# Patient Record
Sex: Male | Born: 1985 | Race: White | Hispanic: No | Marital: Single | Smoking: Current every day smoker
Health system: Southern US, Community
[De-identification: ages and names within clinical notes are randomized; demographics above are authoritative.]

## PROBLEM LIST (undated history)

## (undated) DIAGNOSIS — R569 Unspecified convulsions: Secondary | ICD-10-CM

## (undated) DIAGNOSIS — H5440 Blindness, one eye, unspecified eye: Secondary | ICD-10-CM

## (undated) DIAGNOSIS — R51 Headache: Secondary | ICD-10-CM

## (undated) DIAGNOSIS — M21372 Foot drop, left foot: Secondary | ICD-10-CM

## (undated) DIAGNOSIS — Z9889 Other specified postprocedural states: Secondary | ICD-10-CM

## (undated) DIAGNOSIS — M8700 Idiopathic aseptic necrosis of unspecified bone: Secondary | ICD-10-CM

## (undated) DIAGNOSIS — R112 Nausea with vomiting, unspecified: Secondary | ICD-10-CM

## (undated) DIAGNOSIS — R040 Epistaxis: Secondary | ICD-10-CM

## (undated) DIAGNOSIS — K219 Gastro-esophageal reflux disease without esophagitis: Secondary | ICD-10-CM

## (undated) DIAGNOSIS — Z5189 Encounter for other specified aftercare: Secondary | ICD-10-CM

## (undated) DIAGNOSIS — F419 Anxiety disorder, unspecified: Secondary | ICD-10-CM

## (undated) DIAGNOSIS — H9191 Unspecified hearing loss, right ear: Secondary | ICD-10-CM

## (undated) HISTORY — PX: LUMBAR DISC SURGERY: SHX700

## (undated) HISTORY — PX: KNEE ARTHROSCOPY: SUR90

## (undated) HISTORY — PX: APPENDECTOMY: SHX54

---
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
## 2003-01-08 ENCOUNTER — Encounter: Payer: Self-pay | Admitting: Neurosurgery

## 2003-01-08 ENCOUNTER — Encounter: Admission: RE | Admit: 2003-01-08 | Discharge: 2003-01-08 | Payer: Self-pay | Admitting: Neurosurgery

## 2003-03-28 ENCOUNTER — Encounter
Admission: RE | Admit: 2003-03-28 | Discharge: 2003-06-26 | Payer: Self-pay | Admitting: Physical Medicine & Rehabilitation

## 2003-05-10 ENCOUNTER — Encounter
Admission: RE | Admit: 2003-05-10 | Discharge: 2003-08-08 | Payer: Self-pay | Admitting: Physical Medicine & Rehabilitation

## 2003-05-31 ENCOUNTER — Ambulatory Visit (HOSPITAL_COMMUNITY)
Admission: RE | Admit: 2003-05-31 | Discharge: 2003-05-31 | Payer: Self-pay | Admitting: Physical Medicine & Rehabilitation

## 2003-06-07 ENCOUNTER — Ambulatory Visit (HOSPITAL_COMMUNITY)
Admission: RE | Admit: 2003-06-07 | Discharge: 2003-06-07 | Payer: Self-pay | Admitting: Physical Medicine & Rehabilitation

## 2003-09-23 ENCOUNTER — Encounter
Admission: RE | Admit: 2003-09-23 | Discharge: 2003-12-22 | Payer: Self-pay | Admitting: Physical Medicine & Rehabilitation

## 2003-12-25 ENCOUNTER — Encounter
Admission: RE | Admit: 2003-12-25 | Discharge: 2004-03-24 | Payer: Self-pay | Admitting: Physical Medicine & Rehabilitation

## 2004-01-01 ENCOUNTER — Ambulatory Visit (HOSPITAL_COMMUNITY): Admission: RE | Admit: 2004-01-01 | Discharge: 2004-01-01 | Payer: Self-pay | Admitting: Internal Medicine

## 2004-07-05 HISTORY — PX: BRAIN SURGERY: SHX531

## 2005-01-11 ENCOUNTER — Encounter: Admission: RE | Admit: 2005-01-11 | Discharge: 2005-01-11 | Payer: Self-pay | Admitting: Neurosurgery

## 2005-01-20 ENCOUNTER — Inpatient Hospital Stay (HOSPITAL_COMMUNITY): Admission: RE | Admit: 2005-01-20 | Discharge: 2005-01-24 | Payer: Self-pay | Admitting: Neurosurgery

## 2005-02-23 ENCOUNTER — Encounter: Admission: RE | Admit: 2005-02-23 | Discharge: 2005-02-23 | Payer: Self-pay | Admitting: Neurosurgery

## 2005-11-01 ENCOUNTER — Encounter: Admission: RE | Admit: 2005-11-01 | Discharge: 2005-11-01 | Payer: Self-pay | Admitting: Orthopedic Surgery

## 2005-11-09 ENCOUNTER — Encounter: Payer: Self-pay | Admitting: Orthopedic Surgery

## 2005-11-10 ENCOUNTER — Encounter: Admission: RE | Admit: 2005-11-10 | Discharge: 2005-11-10 | Payer: Self-pay | Admitting: Orthopedic Surgery

## 2005-11-21 ENCOUNTER — Emergency Department (HOSPITAL_COMMUNITY): Admission: EM | Admit: 2005-11-21 | Discharge: 2005-11-22 | Payer: Self-pay | Admitting: Emergency Medicine

## 2005-12-02 ENCOUNTER — Encounter: Payer: Self-pay | Admitting: Vascular Surgery

## 2005-12-02 ENCOUNTER — Ambulatory Visit (HOSPITAL_COMMUNITY): Admission: RE | Admit: 2005-12-02 | Discharge: 2005-12-02 | Payer: Self-pay | Admitting: Orthopedic Surgery

## 2005-12-03 ENCOUNTER — Encounter: Payer: Self-pay | Admitting: Orthopedic Surgery

## 2005-12-21 ENCOUNTER — Inpatient Hospital Stay (HOSPITAL_COMMUNITY): Admission: RE | Admit: 2005-12-21 | Discharge: 2005-12-24 | Payer: Self-pay | Admitting: Orthopedic Surgery

## 2006-08-26 ENCOUNTER — Encounter: Admission: RE | Admit: 2006-08-26 | Discharge: 2006-08-26 | Payer: Self-pay | Admitting: Neurosurgery

## 2006-10-14 ENCOUNTER — Emergency Department (HOSPITAL_COMMUNITY): Admission: EM | Admit: 2006-10-14 | Discharge: 2006-10-15 | Payer: Self-pay | Admitting: Emergency Medicine

## 2008-01-16 ENCOUNTER — Ambulatory Visit (HOSPITAL_COMMUNITY): Admission: RE | Admit: 2008-01-16 | Discharge: 2008-01-17 | Payer: Self-pay | Admitting: Orthopedic Surgery

## 2010-01-19 ENCOUNTER — Encounter: Admission: RE | Admit: 2010-01-19 | Discharge: 2010-01-19 | Payer: Self-pay | Admitting: Neurosurgery
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
## 2010-11-20 NOTE — Op Note (Signed)
[place]  Patient:    [name], [name]                      MRN: [id number] Proc. Date: 10/06/00 Adm. Date:  [id number] Attending:  Vikki Ports.                           Operative Report  PREOPERATIVE DIAGNOSIS:  Acute appendicitis.  POSTOPERATIVE DIAGNOSIS:  Ruptured appendicitis.  OPERATION PERFORMED:  Laparoscopic ruptured appendectomy.  SURGEON:  Stephenie Acres, M.D.  ASSISTANT:  Chevis Pretty, M.D.  ANESTHESIA:  General.  DESCRIPTION OF PROCEDURE:  The patient was taken to the operating room and placed in supine position.  After adequate anesthesia was induced using endotracheal tube the abdomen was prepped and draped in normal sterile fashion.  Using a short superumbilical vertical midline incision, I dissected down to the fascia.  The fascia was opened vertically.  The peritoneum was entered and a 90 Vicryl pursestring suture was placed around the fascial defect.  The Hasson trocar was placed in the abdomen.  The abdomen was insufflated with carbon dioxide.  Under direct visualization, a 5 mm port was placed in the right upper quadrant and a 12 mm port was placed just left of the midline in the superpubic region.  There was a significant amount of small bowel adherent down to the right lower quadrant and pelvis. This was mobilized.  There was a fair amount of exudate on the serosa of the small bowel.  The tip of the appendix was identified.  It appeared to be slightly necrotic and there was some exudative type fluid in the pelvis.  The appendix was mobilized and retracted anteriorly and dissection of the mesentery was undertaken.  The appendiceal artery was dissected, triply clipped and divided. The base of the appendix at its junction with the cecum was completely identified and was divided using a laparoscopic GIA stapling device.  It did not completely cut across the base of the appendix and therefore a second  fire was undertaken using the GIA stapling device.  The appendix then was placed in an Endo Catch bag.  The pelvis and the right lower quadrant were copiously irrigated with 4L of saline solution until fluid was completely clear.  The appendix was then removed in the Endo catch bag through the umbilical port. Adequate hemostasis was ensured.  All incisions were injected using Marcaine. The superumbilical fascial defect was closed with the 0 Vicryl pursestring suture.  Skin incisions were closed in subcuticular 4-0 Monocryl. Steri-Strips, sterile dressings were applied.  The patient tolerated the procedure well and went to PACU in good condition. DD:  10/06/00 TD:  10/06/00 Job: [id number]

## 2010-11-20 NOTE — Assessment & Plan Note (Signed)
[name] is back regarding his TBI and polytrauma, as well as his spinal cord  injury.  His foot pain seems to be a bit better with the Topamax being  started.  He is still having pain over the right first toe and the dorsum of  the foot.  His behavior has been okay off the Tegretol.  He is still limited  in ambulation secondary to mental and physical fatigue and right foot pain.  Mom notes some weight loss with the Topamax.  He wonders if it is  contributing to some occasional diarrhea as well.  Mom does not note any  obvious inappropriate behavior and she seems to put her foot down on any  outbursts that are noted.  The patient was seen by orthopedic surgery a few  weeks back who felt that he was stable from a standpoint of avascular  necrosis in his foot.  [name] follows up with Dr. Karleen Hampshire regarding his  vision.  He denies any new headaches or double vision at this point.  [name]  currently walks with his right CAM boot.   REVIEW OF SYSTEMS:  The patient denies any shortness of breath, chest pain,  nausea, vomiting, diarrhea.  He does report a new rash on the back and near  the axillary area along the T4 level.  Sleep has been fair although his feet  will bother him while he is sitting down or at nighttime.  He does not mind  the cold.   PHYSICAL EXAMINATION:  GENERAL:  The patient is pleasant in no acute  distress.  VITAL SIGNS:  Blood pressure 106/69, pulse is 83, saturating 100% on room  air.  EXTREMITIES:  On examination of the lower extremities, he has improving  strength in the foot.  It is  3-3+/5 with ankle dorsiflexion and plantar flexion.  Quadriceps strength and  hamstrings were 4-4+/5.  Left ankle seems to be stable to unchanged.  The  patient ambulated with his CAM boot today on the right foot and did pretty  well from a stability and fluidity standpoint in his gait.  There was less  antalgia noted on the right side.  Skin was warm to touch but remained  hypersensitive  in the foot and the left foot was nontender.  He was wearing  a work boot without discomfort.  NEUROLOGIC:  Cognitively, the patient seemed to be generally appropriate  although laughed at times.  Cranial nerve exam was intact.  We did see some  maculopapular rash that was scabbing in the right axillary area and there  were also two spots approximately 2 x 3 inches respectively on the mid back  at the same level.   ASSESSMENT:  1. Status post traumatic brain injury.  2. Thoracic myelopathy.  3. 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  4. Emotional dyscontrol syndrome.  5. Probable shingles.   PLAN:  1. We will place him on a trial of acyclovir 4000 mg a day for seven days.     He may place some hydrocortisone cream to the affected areas as well.  2. Increase his Topamax to 200 mg q.h.s.  He may see some further untoward     side effects such as diarrhea and we may have to consider stopping it if     that is the case.  3. The patient needs to continue with his exercise program at home including     walking and social interaction.  4. I will see the patient  back in approximately six weeks' to two months'     time.      [name], M.D.   ZTS/MedQ  D:  07/29/2003 14:56:34  T:  07/29/2003 15:31:22  Job #:  [id number]
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
## 2010-11-20 NOTE — Discharge Summary (Signed)
[place]  Patient:    [name], [name]                      MRN: [id number] Adm. Date:  [id number] Disc. Date: [id number] Attending:  Vikki Ports.                           Discharge Summary  ADMISSION DIAGNOSIS:  Appendicitis.  DISCHARGE DIAGNOSIS:  Ruptured appendicitis.  CONDITION ON DISCHARGE:  Good and improved.  DISPOSITION:  Discharged to home.  HISTORY OF PRESENT ILLNESS:  The patient is a 25 year old white male with a two- to three-day history of abdominal pain localizing to the right lower quadrant.  The patient was seen in the emergency room and underwent CT scan of the abdomen which was consistent with probable acute appendicitis.  White blood cell count was 18,000.  The patient was tender on examination in the right lower quadrant with positive rebound.  The patient had been given IV Toradol in the emergency room and, therefore, I opted to delay surgery for six hours.  HOSPITAL COURSE:  The patient was then taken to the operating room where he underwent a laparoscopic ruptured appendectomy with copious irrigation of the abdomen.  Postoperatively he did well but sustained an ileus for two to three days, having intermittent emesis.  He began having diarrhea on postoperative day #2, was started on a clear liquid diet which he tolerated, and was advanced to a regular diet.  His white count decreased to 11,000, and he became afebrile by postoperative day #3.  Incisions were clean, and he was discharged to home.  DISCHARGE MEDICATIONS:  Vicodin for pain.  FOLLOW-UP:  With me in one week. DD:  10/25/00 TD:  10/26/00 Job: [id number]

## 2010-11-20 NOTE — Assessment & Plan Note (Signed)
HISTORY:  The patient is here regarding his TBI and spinal cord injury.  From the pain standpoint, the patient has been doing a little bit better.  His affect is generally stable and pleasant.  He has had persistent  diarrhea.  He has had diarrhea despite the change from Topamax to Cymbalta,  and being off the Tegretol.  His mother did not call me with further  complaints of this, and after questioning today, he is having at least 10-15  loose stools on a regular basis.  They are not always loose, but generally  are liquid in form.  He denies any fever.  He does note frequent abdominal  cramping.  The patient rates his pain at a 4/10 on average.  He still wears his cam  boot on the right foot, but often walks with his shoe.  His toes remain  sensitive.  He has left foot drop still.  The patient denies any problems  with sleep.  He tries to remain as active as he can with his GI issues at  present.   REVIEW OF SYSTEMS:  The patient denies any chest pain, shortness of breath,  cold or flu symptoms.  He denies any new weakness, numbness, spasms, or  problems with his vision.  He still has problems with field cut, due to his  optic nerve injury.  His mood has been good.  He denies headaches.  He has  had some occasional nausea, abdominal spasms, and the diarrhea as mentioned  above.  He denies fever or chills.  He has lost some weight, to a moderate  standpoint, but has grown essentially an inch over the last year's time.   PHYSICAL EXAMINATION:  VITAL SIGNS:  Blood pressure stable, afebrile.  GENERAL:  The patient is alert and oriented.  He remains a bit playful and  distractable, but focuses when questioned.  EXTREMITIES/NEUROLOGIC:  On examination of the lower extremities, the  strength is improving in the right foot at 4/5.  He still has some  difficulties with left ankle eversion and inversion.  Left ankle  dorsiflexion is 1+ still with plantar flexion 2+ to 3.  Hamstrings are 4+ to  5/5 bilaterally.  Right foot hypersensitivity has improved, and really more  so at the toes at this point.  His cranial nerve examination was grossly  intact except for ongoing visual dysfunction.  The patient has no ataxia.  Sensory function within normal limits in the upper extremities.  The lower  extremity sensory examination as mentioned above.  Reflexes are slightly  exaggerated in the lower extremities.  Cognition generally is appropriate  and unchanged.  SKIN:  Is intact.  ABDOMEN:  Was nondistended today.  Bowel sounds are positive.  The patient  had no obvious pain today.   ASSESSMENT:  1. Status post traumatic brain injury.  2. Thoracic myelopathy.  3. 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  4. Behavioral dyscontrol syndrome.  5. Optic nerve injury.  6. Chronic diarrhea.   PLAN:  1. The patient is doing well from the standpoint of his pain.  I would like     to increase his Cymbalta to 60 mg daily.  Hopefully this will reduce his     pain to a minimal amount, and he can get back to normal shoe wear and     walking.  2. I will make a referral to [facility], to assess the patient's chronic     diarrhea, due to the chronicity  of this and the presence despite coming     off of medications and changing to others.  We will continue with the     Cymbalta at the dose mentioned above, as I doubt that this is causing his     problems.  I did write the patient a prescription for Levbid 0.375 mg     q.12h., to see if this may cut down some of his abdominal spasms and     potentially some of his diarrhea frequency.  3. We discussed the patient's medical coverage, and I recommended that he go     down to the Social Security     Disability Department to discuss Medicare benefits.  4. I will see the patient back in approximately two months' time.      Ranelle Oyster, M.D.   ZTS/MedQ  D:  11/19/2003 12:51:16  T:  11/19/2003 13:34:07  Job #:  [id number]
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
## 2010-11-20 NOTE — Op Note (Signed)
. [facility]  Patient:    BRAYON, BIELEFELD                      MRN: [id number] Proc. Date: 02/24/00 Adm. Date:  [id number] Attending:  Marlowe Shores CC:         Artist Pais Weingold, M.D. x 2   Operative Report  PREOPERATIVE DIAGNOSIS:  Mass, dorsal aspect right index finger, PIP joint.  POSTOPERATIVE DIAGNOSIS:  Mass, dorsal aspect right index finger, PIP joint.  PROCEDURE:  Excisional biopsy, deep.  SURGEON:  Artist Pais. Mina Marble, M.D.  ASSISTANT:  Junius Roads. Ireton, P.A.C.  ANESTHESIA:  General anesthesia.  TOURNIQUET TIME:  28 minutes.  COMPLICATIONS:  None.  DRAINS:  None.  SPECIMENS:  One specimen sent.  DESCRIPTION OF PROCEDURE:  The patient was taken to the operating room and after induction of adequate general anesthesia, the right upper extremity was prepped and draped in the usual sterile fashion.  Esmarch was used to exsanguinate the limb and the tourniquet was inflated to 250 mmHg.  At this point in time, a 3 cm incision was made over the index finger of the right hand, radial aspect, longitudinally across the PIP joint over a large 1.5 x 1.5 cm mass.  The incision was taken down through the skin and subcutaneous tissues until a cystic-type mass was identified.  The mass appeared to be an inclusion-type cyst and was carefully dissected free from the underlying extensor mechanism.  After a careful dissection down the extensor mechanism, a small amount of the cyst appeared to be coming also from possibly the joint capsule.  A small arthrotomy was made and a synovectomy was performed as well. All the specimens were sent for pathological confirmation.  The wound was then thoroughly irrigated.  Hemostasis was achieved with bipolar cautery and the skin was then closed with 5-0 nylon in a combination of simple and horizontal mattress sutures.  A sterile dressing of Xeroform, 4 x 4s, fluffs, and a compressive dressing was  applied as well as a splint with PIP joint extension. The patient tolerated the procedure well and went to the recovery room in stable condition. DD:  02/24/00 TD:  02/24/00 Job: [id number]
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
## 2011-01-07 ENCOUNTER — Other Ambulatory Visit: Payer: Self-pay | Admitting: Orthopedic Surgery

## 2011-01-07 ENCOUNTER — Encounter (HOSPITAL_COMMUNITY): Payer: Medicare Other

## 2011-01-07 LAB — URINALYSIS, ROUTINE W REFLEX MICROSCOPIC
Bilirubin Urine: NEGATIVE
Glucose, UA: NEGATIVE mg/dL
Leukocytes, UA: NEGATIVE
Nitrite: NEGATIVE
Specific Gravity, Urine: 1.009 (ref 1.005–1.030)
pH: 7 (ref 5.0–8.0)

## 2011-01-07 LAB — DIFFERENTIAL
Basophils Absolute: 0 10*3/uL (ref 0.0–0.1)
Lymphocytes Relative: 40 % (ref 12–46)
Lymphs Abs: 4.9 10*3/uL — ABNORMAL HIGH (ref 0.7–4.0)
Monocytes Absolute: 0.7 10*3/uL (ref 0.1–1.0)
Monocytes Relative: 6 % (ref 3–12)
Neutro Abs: 6.2 10*3/uL (ref 1.7–7.7)
Neutrophils Relative %: 51 % (ref 43–77)

## 2011-01-07 LAB — BASIC METABOLIC PANEL
BUN: 6 mg/dL (ref 6–23)
CO2: 22 mEq/L (ref 19–32)
GFR calc Af Amer: >60 mL/min (ref >60)
GFR calc non Af Amer: >60 mL/min (ref >60)

## 2011-01-07 LAB — CBC
RBC: 5.32 MIL/uL (ref 4.22–5.81)
RDW: 14.2 % (ref 11.5–15.5)

## 2011-01-18 ENCOUNTER — Inpatient Hospital Stay (HOSPITAL_COMMUNITY)
Admission: RE | Admit: 2011-01-18 | Discharge: 2011-01-20 | DRG: 468 | Disposition: A | Discharge status: Home Health | Payer: Medicare Other | Source: Ambulatory Visit | Attending: Orthopedic Surgery | Admitting: Orthopedic Surgery

## 2011-01-18 ENCOUNTER — Inpatient Hospital Stay (HOSPITAL_COMMUNITY): Payer: Medicare Other

## 2011-01-18 DIAGNOSIS — Y831 Surgical operation with implant of artificial internal device as the cause of abnormal reaction of the patient, or of later complication, without mention of misadventure at the time of the procedure: Secondary | ICD-10-CM | POA: Diagnosis present

## 2011-01-18 DIAGNOSIS — R7989 Other specified abnormal findings of blood chemistry: Secondary | ICD-10-CM | POA: Diagnosis present

## 2011-01-18 DIAGNOSIS — H5440 Blindness, one eye, unspecified eye: Secondary | ICD-10-CM | POA: Diagnosis present

## 2011-01-18 DIAGNOSIS — K589 Irritable bowel syndrome without diarrhea: Secondary | ICD-10-CM | POA: Diagnosis present

## 2011-01-18 DIAGNOSIS — Z01812 Encounter for preprocedural laboratory examination: Secondary | ICD-10-CM

## 2011-01-18 DIAGNOSIS — Z87820 Personal history of traumatic brain injury: Secondary | ICD-10-CM

## 2011-01-18 DIAGNOSIS — T84099A Other mechanical complication of unspecified internal joint prosthesis, initial encounter: Principal | ICD-10-CM | POA: Diagnosis present

## 2011-01-18 DIAGNOSIS — E876 Hypokalemia: Secondary | ICD-10-CM | POA: Diagnosis not present

## 2011-01-18 DIAGNOSIS — G40909 Epilepsy, unspecified, not intractable, without status epilepticus: Secondary | ICD-10-CM | POA: Diagnosis present

## 2011-01-18 DIAGNOSIS — Z96649 Presence of unspecified artificial hip joint: Secondary | ICD-10-CM

## 2011-01-18 DIAGNOSIS — F17200 Nicotine dependence, unspecified, uncomplicated: Secondary | ICD-10-CM | POA: Diagnosis present

## 2011-01-18 HISTORY — PX: JOINT REPLACEMENT: SHX530

## 2011-01-18 LAB — TYPE AND SCREEN
ABO/RH(D): O POS
Antibody Screen: NEGATIVE

## 2011-01-18 LAB — GRAM STAIN
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
## 2011-01-19 ENCOUNTER — Inpatient Hospital Stay (HOSPITAL_COMMUNITY): Payer: Medicare Other

## 2011-01-19 LAB — BASIC METABOLIC PANEL
BUN: 5 mg/dL — ABNORMAL LOW (ref 6–23)
Chloride: 111 mEq/L (ref 96–112)
GFR calc Af Amer: >60 mL/min (ref >60)
Glucose, Bld: 104 mg/dL — ABNORMAL HIGH (ref 70–99)
Potassium: 3.2 mEq/L — ABNORMAL LOW (ref 3.5–5.1)

## 2011-01-19 LAB — CBC
HCT: 37.9 % — ABNORMAL LOW (ref 39.0–52.0)
Hemoglobin: 12.6 g/dL — ABNORMAL LOW (ref 13.0–17.0)
MCHC: 33.2 g/dL (ref 30.0–36.0)
MCV: 92.4 fL (ref 78.0–100.0)
RBC: 4.1 MIL/uL — ABNORMAL LOW (ref 4.22–5.81)
WBC: 9.9 10*3/uL (ref 4.0–10.5)

## 2011-01-20 LAB — CBC
HCT: 40 % (ref 39.0–52.0)
MCH: 29.7 pg (ref 26.0–34.0)
MCHC: 31.8 g/dL (ref 30.0–36.0)
MCV: 93.7 fL (ref 78.0–100.0)
RBC: 4.27 MIL/uL (ref 4.22–5.81)
RDW: 14.7 % (ref 11.5–15.5)
WBC: 12.5 10*3/uL — ABNORMAL HIGH (ref 4.0–10.5)

## 2011-01-20 LAB — BASIC METABOLIC PANEL
CO2: 21 mEq/L (ref 19–32)
Chloride: 111 mEq/L (ref 96–112)
Creatinine, Ser: 0.82 mg/dL (ref 0.50–1.35)

## 2011-01-21 LAB — BODY FLUID CULTURE: Culture: NO GROWTH
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
## 2011-01-23 LAB — ANAEROBIC CULTURE

## 2011-01-25 NOTE — Discharge Summary (Signed)
NAMESAMAJ, WESSELLS               ACCOUNT NO.:  [id number]  MEDICAL RECORD NO.:  [id number]  LOCATION:  1606                         FACILITY:  [facility]  PHYSICIAN:  Madlyn Frankel. Charlann Boxer, M.D.  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  DISCHARGE DIAGNOSES: 1. 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 2. History of traumatic brain injury. 3. Irritable bowel syndrome.  ADMITTING HISTORY:  [name] is a 25 year old male who has been a patient of mine for some time.  He has had significant brain injury and was on high dose IV steroids which ultimately led to avascular necrosis and pain in his left hip.  About 4 years ago, he underwent a left total hip replacement with ASR hip.  He had been doing fairly well without any major issues or complaints until recently when he presented to the office with increasing pain.  Radiographs revealed a failed acetabular component.  He also has a history of right hip avascular necrosis. Given these findings and his pain, he was ready to proceed with more definitive measures.  Risks and benefits were discussed and reviewed. Consent was obtained.  HOSPITAL COURSE:  The patient was admitted for same-day surgery for revision left total hip replacement.  He underwent conversion of his acetabular shell to a revision Gription cup.  Please see operative note for full details of the procedure.  After routine stay in the recovery room, he was transferred to orthopedic ward where he remained for his 2- day hospital stay.  On postop day #1, his Foley catheter and Hemovac drain were removed.  His hematocrit was 37.9 on day #1 with stable electrolytes.  There was some incidents of low or high blood pressure on his first night but this may have been related to pain medicines  as volume and intake and output remained output.  By postoperative day #2, he was ready to be discharged to home.  He had been seen and evaluated by Physical Therapy, had limited 50% weightbearing on his left lower extremity.  CONDITION ON DISCHARGE:  Stable.  DISCHARGE INSTRUCTIONS:  The patient will return to see Dr. Durene Romans at [facility] at [phone] in two weeks' time.  He may shower with his current dressing which is an Aquacel dressing for 8 days and then he will remove this and place dry gauze and tape.  He will be on a regular diet.  Discharge therapy instructions will be 50% weightbearing on his left lower extremity until further directed in my office.  DISCHARGE MEDICATIONS: 1. Colace 100 mg p.o. b.i.d. as needed for constipation while on pain     medicine. 2. MiraLax 17 g p.o. b.i.d. as needed for constipation while on pain     medicine. 3. Aspirin 325 mg b.i.d. for 30 days. 4. Maalox as needed for reflux. 5. Robaxin 500 mg every 6 hours as needed for pain. 6. Oxycodone 5 to 10 mg every 4 to 6 hours as needed for pain. 7. Dicyclomine 20 mg b.i.d. 8.  Topamax 200 mg b.i.d.  Please note that they can switch over to Norco if he has decrease symptoms and his mom understands his pain level, to manage appropriately at home.     Madlyn Frankel Charlann Boxer, M.D.     MDO/MEDQ  D:  01/20/2011  T:  01/20/2011  Job:  [id number]  Electronically Signed by Durene Romans M.D. on 01/25/2011 10:14:09 AM

## 2011-04-01 LAB — URINALYSIS, ROUTINE W REFLEX MICROSCOPIC
Nitrite: NEGATIVE
Protein, ur: NEGATIVE
Urobilinogen, UA: 0.2
pH: 6

## 2011-04-01 LAB — CBC
Platelets: 190
RDW: 14.3
WBC: 10.7 — ABNORMAL HIGH

## 2011-04-01 LAB — PROTIME-INR
INR: 1
Prothrombin Time: 12.9

## 2011-04-01 LAB — DIFFERENTIAL
Basophils Absolute: 0
Lymphocytes Relative: 31
Lymphs Abs: 3.3
Neutro Abs: 6.4
Neutrophils Relative %: 60

## 2011-04-01 LAB — BASIC METABOLIC PANEL
BUN: 8
Calcium: 9.5
Creatinine, Ser: 0.92
GFR calc non Af Amer: >60
Glucose, Bld: 90
Potassium: 3.8

## 2011-08-11 DIAGNOSIS — T84069A Wear of articular bearing surface of unspecified internal prosthetic joint, initial encounter: Secondary | ICD-10-CM | POA: Diagnosis not present

## 2011-09-28 ENCOUNTER — Encounter (HOSPITAL_COMMUNITY): Payer: Self-pay

## 2011-09-29 DIAGNOSIS — M25559 Pain in unspecified hip: Secondary | ICD-10-CM | POA: Diagnosis not present

## 2011-09-29 NOTE — Progress Notes (Signed)
H&P performed 09/29/11 Dictation # [id number]

## 2011-09-30 ENCOUNTER — Other Ambulatory Visit (HOSPITAL_COMMUNITY): Payer: Medicare Other
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
## 2011-10-04 ENCOUNTER — Encounter (HOSPITAL_COMMUNITY): Payer: Self-pay

## 2011-10-04 ENCOUNTER — Encounter (HOSPITAL_COMMUNITY)
Admission: RE | Admit: 2011-10-04 | Discharge: 2011-10-04 | Disposition: A | Discharge status: Home / Self Care | Payer: Medicare Other | Source: Ambulatory Visit | Attending: Orthopedic Surgery | Admitting: Orthopedic Surgery

## 2011-10-04 DIAGNOSIS — G40209 Localization-related (focal) (partial) symptomatic epilepsy and epileptic syndromes with complex partial seizures, not intractable, without status epilepticus: Secondary | ICD-10-CM | POA: Diagnosis not present

## 2011-10-04 DIAGNOSIS — Z87820 Personal history of traumatic brain injury: Secondary | ICD-10-CM | POA: Diagnosis not present

## 2011-10-04 HISTORY — DX: Unspecified convulsions: R56.9

## 2011-10-04 HISTORY — DX: Foot drop, left foot: M21.372

## 2011-10-04 HISTORY — DX: Unspecified hearing loss, right ear: H91.91

## 2011-10-04 HISTORY — DX: Gastro-esophageal reflux disease without esophagitis: K21.9

## 2011-10-04 HISTORY — DX: Epistaxis: R04.0

## 2011-10-04 HISTORY — DX: Idiopathic aseptic necrosis of unspecified bone: M87.00

## 2011-10-04 HISTORY — DX: Encounter for other specified aftercare: Z51.89

## 2011-10-04 HISTORY — DX: Blindness, one eye, unspecified eye: H54.40

## 2011-10-04 HISTORY — DX: Other specified postprocedural states: Z98.890

## 2011-10-04 HISTORY — DX: Anxiety disorder, unspecified: F41.9

## 2011-10-04 HISTORY — DX: Nausea with vomiting, unspecified: R11.2

## 2011-10-04 HISTORY — DX: Headache: R51

## 2011-10-04 LAB — URINALYSIS, ROUTINE W REFLEX MICROSCOPIC
Glucose, UA: NEGATIVE mg/dL
Hgb urine dipstick: NEGATIVE
Protein, ur: NEGATIVE mg/dL
Urobilinogen, UA: 1 mg/dL (ref 0.0–1.0)

## 2011-10-04 LAB — CBC
HCT: 48.6 % (ref 39.0–52.0)
MCH: 30.5 pg (ref 26.0–34.0)
MCHC: 33.3 g/dL (ref 30.0–36.0)
MCV: 91.4 fL (ref 78.0–100.0)
Platelets: 223 10*3/uL (ref 150–400)
RDW: 14.1 % (ref 11.5–15.5)
WBC: 11.2 10*3/uL — ABNORMAL HIGH (ref 4.0–10.5)

## 2011-10-04 LAB — SURGICAL PCR SCREEN
MRSA, PCR: NEGATIVE
Staphylococcus aureus: NEGATIVE

## 2011-10-04 LAB — BASIC METABOLIC PANEL
BUN: 5 mg/dL — ABNORMAL LOW (ref 6–23)
CO2: 22 mEq/L (ref 19–32)
Calcium: 9.7 mg/dL (ref 8.4–10.5)
Creatinine, Ser: 0.99 mg/dL (ref 0.50–1.35)

## 2011-10-04 LAB — DIFFERENTIAL
Basophils Absolute: 0.1 10*3/uL (ref 0.0–0.1)
Basophils Relative: 1 % (ref 0–1)
Eosinophils Absolute: 0.3 10*3/uL (ref 0.0–0.7)
Eosinophils Relative: 3 % (ref 0–5)
Lymphocytes Relative: 35 % (ref 12–46)
Monocytes Absolute: 0.6 10*3/uL (ref 0.1–1.0)

## 2011-10-04 LAB — PROTIME-INR: Prothrombin Time: 13.8 seconds (ref 11.6–15.2)

## 2011-10-04 NOTE — Patient Instructions (Signed)
YOUR SURGERY IS SCHEDULED ON:  Tuesday  4/9  AT 8:45 AM  REPORT TO [place] [facility] AT:  6:15 AM      PHONE # FOR SHORT STAY IS [phone]  DO NOT EAT OR DRINK ANYTHING AFTER MIDNIGHT THE NIGHT BEFORE YOUR SURGERY.  YOU MAY BRUSH YOUR TEETH, RINSE OUT YOUR MOUTH--BUT NO WATER, NO FOOD, NO CHEWING GUM, NO MINTS, NO CANDIES, NO CHEWING TOBACCO.  PLEASE TAKE THE FOLLOWING MEDICATIONS THE AM OF YOUR SURGERY WITH A FEW SIPS OF WATER:  TOPIRAMATE AND DICYCLOMINE       DO NOT BRING VALUABLES, MONEY, CREDIT CARDS.  CONTACT LENS, DENTURES / PARTIALS, GLASSES SHOULD NOT BE WORN TO SURGERY AND IN MOST CASES-HEARING AIDS WILL NEED TO BE REMOVED.  BRING YOUR GLASSES CASE, ANY EQUIPMENT NEEDED FOR YOUR CONTACT LENS. FOR PATIENTS ADMITTED TO THE HOSPITAL--CHECK OUT TIME THE DAY OF DISCHARGE IS 11:00 AM.  ALL INPATIENT ROOMS ARE PRIVATE - WITH BATHROOM, TELEPHONE, TELEVISION AND WIFI INTERNET. IF YOU ARE BEING DISCHARGED THE SAME DAY OF YOUR SURGERY--YOU CAN NOT DRIVE YOURSELF HOME--AND SHOULD NOT GO HOME ALONE BY TAXI OR BUS.  NO DRIVING OR OPERATING MACHINERY FOR 24 HOURS FOLLOWING ANESTHESIA / PAIN MEDICATIONS.                            SPECIAL INSTRUCTIONS:  CHLORHEXIDINE SOAP SHOWER (other brand names are Betasept and Hibiclens ) PLEASE SHOWER WITH CHLORHEXIDINE THE NIGHT BEFORE YOUR SURGERY AND THE AM OF YOUR SURGERY. DO NOT USE CHLORHEXIDINE ON YOUR FACE OR PRIVATE AREAS--YOU MAY USE YOUR NORMAL SOAP THOSE AREAS AND YOUR NORMAL SHAMPOO.  WOMEN SHOULD AVOID SHAVING UNDER ARMS AND SHAVING LEGS 48 HOURS BEFORE USING CHLORHEXIDINE TO AVOID SKIN IRRITATION.  DO NOT USE IF ALLERGIC TO CHLORHEXIDINE.  PLEASE READ OVER ANY  FACT SHEETS THAT YOU WERE GIVEN: MRSA INFORMATION, BLOOD TRANSFUSION INFORMATION, INCENTIVE SPIROMETER INFORMATION.

## 2011-10-04 NOTE — Pre-Procedure Instructions (Signed)
CBC WITH DIFF, BMET, PT, PTT, UA AND T/S WERE DONE TODAY PREOP . PT HAS HX OF BRAIN INJURY--PT ABLE TO VOICE WHAT HIS SURGERY WILL BE AND SIGNED HIS CONSENT FOR SURGERY--HIS MOTHER WITH HIM TO HELP WITH HIS MEDICAL HISTORY--SHE STATES HE DOES SIGN ALL HIS LEGAL PAPERS.

## 2011-10-05 NOTE — Pre-Procedure Instructions (Signed)
PT'S MOST RECENT OFFICE NOTE FROM [facility] 10/04/11 ON THIS CHART.

## 2011-10-11 ENCOUNTER — Encounter (HOSPITAL_COMMUNITY): Payer: Self-pay | Admitting: Anesthesiology
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
## 2011-10-12 ENCOUNTER — Encounter (HOSPITAL_COMMUNITY): Payer: Self-pay

## 2011-10-12 ENCOUNTER — Ambulatory Visit (HOSPITAL_COMMUNITY): Payer: Medicare Other

## 2011-10-12 ENCOUNTER — Encounter (HOSPITAL_COMMUNITY)
Admission: RE | Disposition: A | Discharge status: Home Health | Payer: Self-pay | Source: Ambulatory Visit | Attending: Orthopedic Surgery

## 2011-10-12 ENCOUNTER — Inpatient Hospital Stay (HOSPITAL_COMMUNITY)
Admission: RE | Admit: 2011-10-12 | Discharge: 2011-10-14 | DRG: 470 | Disposition: A | Discharge status: Home Health | Payer: Medicare Other | Source: Ambulatory Visit | Attending: Orthopedic Surgery | Admitting: Orthopedic Surgery

## 2011-10-12 ENCOUNTER — Encounter (HOSPITAL_COMMUNITY): Payer: Self-pay | Admitting: *Deleted

## 2011-10-12 ENCOUNTER — Encounter (HOSPITAL_COMMUNITY): Payer: Self-pay | Admitting: Anesthesiology

## 2011-10-12 ENCOUNTER — Ambulatory Visit (HOSPITAL_COMMUNITY): Payer: Medicare Other | Admitting: Anesthesiology

## 2011-10-12 DIAGNOSIS — F411 Generalized anxiety disorder: Secondary | ICD-10-CM | POA: Diagnosis present

## 2011-10-12 DIAGNOSIS — M87059 Idiopathic aseptic necrosis of unspecified femur: Secondary | ICD-10-CM | POA: Diagnosis not present

## 2011-10-12 DIAGNOSIS — Z471 Aftercare following joint replacement surgery: Secondary | ICD-10-CM | POA: Diagnosis not present

## 2011-10-12 DIAGNOSIS — Z96649 Presence of unspecified artificial hip joint: Secondary | ICD-10-CM | POA: Diagnosis not present

## 2011-10-12 DIAGNOSIS — F17200 Nicotine dependence, unspecified, uncomplicated: Secondary | ICD-10-CM | POA: Diagnosis present

## 2011-10-12 DIAGNOSIS — M169 Osteoarthritis of hip, unspecified: Secondary | ICD-10-CM | POA: Diagnosis not present

## 2011-10-12 DIAGNOSIS — Z01812 Encounter for preprocedural laboratory examination: Secondary | ICD-10-CM

## 2011-10-12 DIAGNOSIS — K219 Gastro-esophageal reflux disease without esophagitis: Secondary | ICD-10-CM | POA: Diagnosis not present

## 2011-10-12 DIAGNOSIS — M8970 Major osseous defect, unspecified site: Secondary | ICD-10-CM | POA: Diagnosis not present

## 2011-10-12 DIAGNOSIS — Z9889 Other specified postprocedural states: Secondary | ICD-10-CM

## 2011-10-12 DIAGNOSIS — M25559 Pain in unspecified hip: Secondary | ICD-10-CM | POA: Diagnosis not present

## 2011-10-12 HISTORY — PX: TOTAL HIP ARTHROPLASTY: SHX124

## 2011-10-12 LAB — TYPE AND SCREEN: ABO/RH(D): O POS

## 2011-10-12 SURGERY — ARTHROPLASTY, HIP, TOTAL, ANTERIOR APPROACH
Anesthesia: General | Site: Hip | Laterality: Right | Wound class: Clean

## 2011-10-12 MED ORDER — ONDANSETRON HCL 4 MG/2ML IJ SOLN: 4.0000 mg | Freq: Four times a day (QID) | INTRAMUSCULAR | Status: DC | PRN

## 2011-10-12 MED ORDER — METHOCARBAMOL 500 MG PO TABS
500.0000 mg | ORAL_TABLET | Freq: Four times a day (QID) | ORAL | Status: DC | PRN
  Administered 2011-10-14: 500 mg via ORAL
  Filled 2011-10-12: qty 1

## 2011-10-12 MED ORDER — PHENOL 1.4 % MT LIQD
1.0000 | OROMUCOSAL | Status: DC | PRN
  Filled 2011-10-12: qty 177

## 2011-10-12 MED ORDER — ACETAMINOPHEN 650 MG RE SUPP: 650.0000 mg | Freq: Four times a day (QID) | RECTAL | Status: DC | PRN

## 2011-10-12 MED ORDER — DOCUSATE SODIUM 100 MG PO CAPS
100.0000 mg | ORAL_CAPSULE | Freq: Two times a day (BID) | ORAL | Status: DC
  Administered 2011-10-12 – 2011-10-13 (×3): 100 mg via ORAL
  Filled 2011-10-12 (×6): qty 1

## 2011-10-12 MED ORDER — HYDROMORPHONE HCL PF 1 MG/ML IJ SOLN
INTRAMUSCULAR | Status: AC
  Filled 2011-10-12: qty 1

## 2011-10-12 MED ORDER — FERROUS SULFATE 325 (65 FE) MG PO TABS
325.0000 mg | ORAL_TABLET | Freq: Two times a day (BID) | ORAL | Status: DC
  Administered 2011-10-13 – 2011-10-14 (×3): 325 mg via ORAL
  Filled 2011-10-12 (×4): qty 1

## 2011-10-12 MED ORDER — ONDANSETRON HCL 4 MG/2ML IJ SOLN
4.0000 mg | Freq: Once | INTRAMUSCULAR | Status: AC
  Administered 2011-10-12: 4 mg via INTRAVENOUS

## 2011-10-12 MED ORDER — ONDANSETRON HCL 4 MG/2ML IJ SOLN
INTRAMUSCULAR | Status: AC
  Filled 2011-10-12: qty 2

## 2011-10-12 MED ORDER — METHOCARBAMOL 100 MG/ML IJ SOLN
500.0000 mg | Freq: Four times a day (QID) | INTRAVENOUS | Status: DC | PRN
  Administered 2011-10-12: 500 mg via INTRAVENOUS
  Filled 2011-10-12: qty 5

## 2011-10-12 MED ORDER — TOPIRAMATE 100 MG PO TABS
200.0000 mg | ORAL_TABLET | Freq: Two times a day (BID) | ORAL | Status: DC
Start: 2011-10-12 — End: 2011-10-14
  Administered 2011-10-12 – 2011-10-14 (×4): 200 mg via ORAL
  Filled 2011-10-12 (×5): qty 2

## 2011-10-12 MED ORDER — HYDROMORPHONE HCL PF 1 MG/ML IJ SOLN
0.5000 mg | INTRAMUSCULAR | Status: DC | PRN
  Administered 2011-10-12: 1 mg via INTRAVENOUS
  Administered 2011-10-12: 2 mg via INTRAVENOUS
  Filled 2011-10-12: qty 1
  Filled 2011-10-12: qty 2

## 2011-10-12 MED ORDER — METOCLOPRAMIDE HCL 10 MG PO TABS: 5.0000 mg | ORAL_TABLET | Freq: Three times a day (TID) | ORAL | Status: DC | PRN

## 2011-10-12 MED ORDER — FLEET ENEMA 7-19 GM/118ML RE ENEM: 1.0000 | ENEMA | Freq: Once | RECTAL | Status: AC | PRN

## 2011-10-12 MED ORDER — LACTATED RINGERS IV SOLN
INTRAVENOUS | Status: DC
  Administered 2011-10-12: 1000 mL via INTRAVENOUS
  Administered 2011-10-12 (×3): via INTRAVENOUS

## 2011-10-12 MED ORDER — GLYCOPYRROLATE 0.2 MG/ML IJ SOLN
INTRAMUSCULAR | Status: DC | PRN
  Administered 2011-10-12: .8 mg via INTRAVENOUS

## 2011-10-12 MED ORDER — PROMETHAZINE HCL 25 MG/ML IJ SOLN: 6.2500 mg | INTRAMUSCULAR | Status: DC | PRN

## 2011-10-12 MED ORDER — BISACODYL 5 MG PO TBEC: 5.0000 mg | DELAYED_RELEASE_TABLET | Freq: Every day | ORAL | Status: DC | PRN

## 2011-10-12 MED ORDER — HYDROMORPHONE HCL PF 1 MG/ML IJ SOLN
INTRAMUSCULAR | Status: DC | PRN
  Administered 2011-10-12 (×2): 1 mg via INTRAVENOUS

## 2011-10-12 MED ORDER — NEOSTIGMINE METHYLSULFATE 1 MG/ML IJ SOLN
INTRAMUSCULAR | Status: DC | PRN
  Administered 2011-10-12: 5 mg via INTRAVENOUS

## 2011-10-12 MED ORDER — PROPOFOL 10 MG/ML IV BOLUS
INTRAVENOUS | Status: DC | PRN
  Administered 2011-10-12: 180 mg via INTRAVENOUS

## 2011-10-12 MED ORDER — METOCLOPRAMIDE HCL 5 MG/ML IJ SOLN: 5.0000 mg | Freq: Three times a day (TID) | INTRAMUSCULAR | Status: DC | PRN

## 2011-10-12 MED ORDER — ROCURONIUM BROMIDE 100 MG/10ML IV SOLN
INTRAVENOUS | Status: DC | PRN
  Administered 2011-10-12: 50 mg via INTRAVENOUS
  Administered 2011-10-12: 15 mg via INTRAVENOUS

## 2011-10-12 MED ORDER — ZOLPIDEM TARTRATE 5 MG PO TABS: 5.0000 mg | ORAL_TABLET | Freq: Every evening | ORAL | Status: DC | PRN

## 2011-10-12 MED ORDER — MIDAZOLAM HCL 5 MG/5ML IJ SOLN
INTRAMUSCULAR | Status: DC | PRN
  Administered 2011-10-12: 2 mg via INTRAVENOUS

## 2011-10-12 MED ORDER — ONDANSETRON HCL 4 MG PO TABS: 4.0000 mg | ORAL_TABLET | Freq: Four times a day (QID) | ORAL | Status: DC | PRN

## 2011-10-12 MED ORDER — DIPHENHYDRAMINE HCL 25 MG PO CAPS: 25.0000 mg | ORAL_CAPSULE | Freq: Four times a day (QID) | ORAL | Status: DC | PRN

## 2011-10-12 MED ORDER — FENTANYL CITRATE 0.05 MG/ML IJ SOLN
INTRAMUSCULAR | Status: DC | PRN
  Administered 2011-10-12: 50 ug via INTRAVENOUS
  Administered 2011-10-12: 100 ug via INTRAVENOUS
  Administered 2011-10-12 (×2): 50 ug via INTRAVENOUS

## 2011-10-12 MED ORDER — ACETAMINOPHEN 325 MG PO TABS: 650.0000 mg | ORAL_TABLET | Freq: Four times a day (QID) | ORAL | Status: DC | PRN

## 2011-10-12 MED ORDER — POLYETHYLENE GLYCOL 3350 17 G PO PACK
17.0000 g | PACK | Freq: Two times a day (BID) | ORAL | Status: DC
  Administered 2011-10-12 – 2011-10-13 (×2): 17 g via ORAL
  Filled 2011-10-12 (×5): qty 1

## 2011-10-12 MED ORDER — CEFAZOLIN SODIUM-DEXTROSE 2-3 GM-% IV SOLR
INTRAVENOUS | Status: AC
  Filled 2011-10-12: qty 50

## 2011-10-12 MED ORDER — LIDOCAINE HCL (CARDIAC) 20 MG/ML IV SOLN
INTRAVENOUS | Status: DC | PRN
  Administered 2011-10-12: 100 mg via INTRAVENOUS

## 2011-10-12 MED ORDER — DICYCLOMINE HCL 20 MG PO TABS
20.0000 mg | ORAL_TABLET | Freq: Two times a day (BID) | ORAL | Status: DC
  Administered 2011-10-12 – 2011-10-14 (×4): 20 mg via ORAL
  Filled 2011-10-12 (×6): qty 1

## 2011-10-12 MED ORDER — CHLORHEXIDINE GLUCONATE 4 % EX LIQD: 60.0000 mL | Freq: Once | CUTANEOUS | Status: DC

## 2011-10-12 MED ORDER — ACETAMINOPHEN 10 MG/ML IV SOLN
INTRAVENOUS | Status: AC
  Filled 2011-10-12: qty 100

## 2011-10-12 MED ORDER — HYDROCODONE-ACETAMINOPHEN 7.5-325 MG PO TABS
1.0000 | ORAL_TABLET | ORAL | Status: DC
  Administered 2011-10-12 – 2011-10-13 (×4): 2 via ORAL
  Administered 2011-10-13: 1 via ORAL
  Administered 2011-10-13: 2 via ORAL
  Administered 2011-10-13: 1 via ORAL
  Administered 2011-10-13 – 2011-10-14 (×4): 2 via ORAL
  Filled 2011-10-12: qty 1
  Filled 2011-10-12 (×10): qty 2

## 2011-10-12 MED ORDER — ACETAMINOPHEN 10 MG/ML IV SOLN
INTRAVENOUS | Status: DC | PRN
  Administered 2011-10-12: 1000 mg via INTRAVENOUS

## 2011-10-12 MED ORDER — SODIUM CHLORIDE 0.9 % IV SOLN
100.0000 mL/h | INTRAVENOUS | Status: DC
  Administered 2011-10-12 (×2): 100 mL/h via INTRAVENOUS
  Administered 2011-10-13: 20 mL/h via INTRAVENOUS
  Filled 2011-10-12 (×9): qty 1000

## 2011-10-12 MED ORDER — CEFAZOLIN SODIUM-DEXTROSE 2-3 GM-% IV SOLR
2.0000 g | Freq: Four times a day (QID) | INTRAVENOUS | Status: AC
  Administered 2011-10-12 – 2011-10-13 (×3): 2 g via INTRAVENOUS
  Filled 2011-10-12 (×3): qty 50

## 2011-10-12 MED ORDER — RIVAROXABAN 10 MG PO TABS
10.0000 mg | ORAL_TABLET | ORAL | Status: DC
  Administered 2011-10-13 – 2011-10-14 (×2): 10 mg via ORAL
  Filled 2011-10-12 (×2): qty 1

## 2011-10-12 MED ORDER — CEFAZOLIN SODIUM-DEXTROSE 2-3 GM-% IV SOLR
2.0000 g | Freq: Once | INTRAVENOUS | Status: AC
  Administered 2011-10-12: 2 g via INTRAVENOUS

## 2011-10-12 MED ORDER — HYDROMORPHONE HCL PF 1 MG/ML IJ SOLN
0.2500 mg | INTRAMUSCULAR | Status: DC | PRN
  Administered 2011-10-12 (×4): 0.25 mg via INTRAVENOUS

## 2011-10-12 MED ORDER — MENTHOL 3 MG MT LOZG
1.0000 | LOZENGE | OROMUCOSAL | Status: DC | PRN
  Filled 2011-10-12: qty 9

## 2011-10-12 MED ORDER — ALUM & MAG HYDROXIDE-SIMETH 200-200-20 MG/5ML PO SUSP
30.0000 mL | ORAL | Status: DC | PRN
  Administered 2011-10-12 – 2011-10-13 (×2): 30 mL via ORAL
  Filled 2011-10-12 (×2): qty 30

## 2011-10-12 SURGICAL SUPPLY — 38 items
BAG ZIPLOCK 12X15 (MISCELLANEOUS) ×4 IMPLANT
BLADE SAW SGTL 18X1.27X75 (BLADE) ×2 IMPLANT
CELLS DAT CNTRL 66122 CELL SVR (MISCELLANEOUS) ×1 IMPLANT
CLOTH BEACON ORANGE TIMEOUT ST (SAFETY) ×2 IMPLANT
DERMABOND ADVANCED (GAUZE/BANDAGES/DRESSINGS) ×1
DERMABOND ADVANCED .7 DNX12 (GAUZE/BANDAGES/DRESSINGS) ×1 IMPLANT
DRAPE C-ARM 42X72 X-RAY (DRAPES) ×2 IMPLANT
DRAPE STERI IOBAN 125X83 (DRAPES) ×2 IMPLANT
DRAPE U-SHAPE 47X51 STRL (DRAPES) ×6 IMPLANT
DRSG AQUACEL AG ADV 3.5X10 (GAUZE/BANDAGES/DRESSINGS) ×2 IMPLANT
DRSG TEGADERM 4X4.75 (GAUZE/BANDAGES/DRESSINGS) ×2 IMPLANT
DURAPREP 26ML APPLICATOR (WOUND CARE) ×2 IMPLANT
ELECT BLADE TIP CTD 4 INCH (ELECTRODE) ×2 IMPLANT
ELECT REM PT RETURN 9FT ADLT (ELECTROSURGICAL) ×2
ELECTRODE REM PT RTRN 9FT ADLT (ELECTROSURGICAL) ×1 IMPLANT
EVACUATOR 1/8 PVC DRAIN (DRAIN) ×2 IMPLANT
FACESHIELD LNG OPTICON STERILE (SAFETY) ×8 IMPLANT
GAUZE SPONGE 2X2 8PLY STRL LF (GAUZE/BANDAGES/DRESSINGS) ×1 IMPLANT
GLOVE BIOGEL PI IND STRL 7.5 (GLOVE) ×1 IMPLANT
GLOVE BIOGEL PI IND STRL 8 (GLOVE) ×1 IMPLANT
GLOVE BIOGEL PI INDICATOR 7.5 (GLOVE) ×1
GLOVE BIOGEL PI INDICATOR 8 (GLOVE) ×1
GLOVE ECLIPSE 8.0 STRL XLNG CF (GLOVE) ×2 IMPLANT
GLOVE ORTHO TXT STRL SZ7.5 (GLOVE) ×4 IMPLANT
GOWN BRE IMP PREV XXLGXLNG (GOWN DISPOSABLE) ×4 IMPLANT
GOWN STRL NON-REIN LRG LVL3 (GOWN DISPOSABLE) ×2 IMPLANT
KIT BASIN OR (CUSTOM PROCEDURE TRAY) ×2 IMPLANT
PACK TOTAL JOINT (CUSTOM PROCEDURE TRAY) ×2 IMPLANT
PADDING CAST COTTON 6X4 STRL (CAST SUPPLIES) ×2 IMPLANT
RTRCTR WOUND ALEXIS 18CM MED (MISCELLANEOUS) ×2
SPONGE GAUZE 2X2 STER 10/PKG (GAUZE/BANDAGES/DRESSINGS) ×1
SUCTION FRAZIER 12FR DISP (SUCTIONS) ×2 IMPLANT
SUT MNCRL AB 4-0 PS2 18 (SUTURE) ×2 IMPLANT
SUT VIC AB 1 CT1 36 (SUTURE) ×8 IMPLANT
SUT VIC AB 2-0 CT1 27 (SUTURE) ×2
SUT VIC AB 2-0 CT1 TAPERPNT 27 (SUTURE) ×2 IMPLANT
TOWEL OR 17X26 10 PK STRL BLUE (TOWEL DISPOSABLE) ×4 IMPLANT
TRAY FOLEY CATH 14FRSI W/METER (CATHETERS) ×2 IMPLANT

## 2011-10-12 NOTE — Progress Notes (Signed)
Utilization review completed.  

## 2011-10-12 NOTE — Op Note (Signed)
NAME:  [name]                ACCOUNT NO.: [id number]      MEDICAL RECORD NO.: [id number]      FACILITY:  [facility]      PHYSICIAN:  Rodolfo Gaster D  DATE OF BIRTH:  [birth year]     DATE OF PROCEDURE:  10/12/2011                                 OPERATIVE REPORT         PREOPERATIVE DIAGNOSIS: Right  hip avascular necrosis.      POSTOPERATIVE DIAGNOSIS:  Right hip avascular necrosis.      PROCEDURE:  Right total hip replacement through an anterior approach   utilizing DePuy THR system, component size 54 pinnacle cup, a size 36+4 neutral   Altrex liner, a size 8 standard Tri Lock stem with a 36+1.5 delta ceramic   ball.      SURGEON:  Madlyn Frankel. Charlann Boxer, M.D.      ASSISTANT:  Lanney Gins, PA      ANESTHESIA:  General.      SPECIMENS:  None.      COMPLICATIONS:  None.      BLOOD LOSS:  500 cc     DRAINS:  One Hemovac.      INDICATION OF THE PROCEDURE:  [name] is a 26 y.o. male who had   presented to office for evaluation of right hip pain.  Radiographs revealed   progressive avascular necrotic changes with flattening of the femoral head and bone on bone changes   to the  hip joint.  The patient had painful limited range of   motion significantly affecting their overall quality of life.  The patient was failing to    respond to conservative measures, and at this point was ready   to proceed with more definitive measures.  The patient has noted progressive   degenerative changes in his hip, progressive problems and dysfunction   with regarding the hip prior to surgery.  Consent was obtained for   benefit of pain relief.  Specific risk of infection, DVT, component   failure, dislocation, need for revision surgery, as well discussion of   the anterior versus posterior approach were reviewed.  Consent was   obtained for benefit of anterior pain relief through an anterior   approach.      PROCEDURE IN DETAIL:  The patient was brought to operative theater.   Once  adequate anesthesia, preoperative antibiotics, 2gm Ancef administered.   The patient was positioned supine on the OSI Hanna table.  Once adequate   padding of boney process was carried out, we had predraped out the hip, and  used fluoroscopy to confirm orientation of the pelvis and position.      The right hip was then prepped and draped from proximal iliac crest to   mid thigh with shower curtain technique.      Time-out was performed identifying the patient, planned procedure, and   extremity.     An incision was then made 2 cm distal and lateral to the   anterior superior iliac spine extending over the orientation of the   tensor fascia lata muscle and sharp dissection was carried down to the   fascia of the muscle and protractor placed in the soft tissues.      The fascia was then  incised.  The muscle belly was identified and swept   laterally and retractor placed along the superior neck.  Following   cauterization of the circumflex vessels and removing some pericapsular   fat, a second cobra retractor was placed on the inferior neck.  A third   retractor was placed on the anterior acetabulum after elevating the   anterior rectus.  A L-capsulotomy was along the line of the   superior neck to the trochanteric fossa, then extended proximally and   distally.  Tag sutures were placed and the retractors were then placed   intracapsular.  We then identified the trochanteric fossa and   orientation of my neck cut, confirmed this radiographically   and then made a neck osteotomy with the femur on traction.  The femoral   head was removed without difficulty or complication.  Traction was let   off and retractors were placed posterior and anterior around the   acetabulum.      The labrum and foveal tissue were debrided.  I began reaming with a 45mm   reamer and reamed up to 53mm reamer with good bony bed preparation and a 54   cup was chosen.  The final 54mm Pinnacle cup was then impacted  under fluoroscopy  to confirm the depth of penetration and orientation with respect to   abduction.  A screw was placed followed by the hole eliminator.  The final   36+4 Altrex liner was impacted with good visualized rim fit.  The cup was positioned anatomically within the acetabular portion of the pelvis.      At this point, the femur was rolled at 80 degrees.  Further capsule was   released off the inferior aspect of the femoral neck.  I then   released the superior capsule proximally.  The hook was placed laterally   along the femur and elevated manually and held in position with the bed   hook.  The leg was then extended and adducted with the leg rolled to 100   degrees of external rotation.  Once the proximal femur was fully   exposed, I used a box osteotome to set orientation.  I then began   broaching with the starting chili pepper broach and passed this by hand and then broached up to 8.  With the 8 broach in place I chose a standard neck, a 36+1.5 ball and did a trial reduction.  The offset was appropriate, leg lengths   appeared to be equal, confirmed radiographically.   Given these findings, I went ahead and dislocated the hip, repositioned all   retractors and positioned the right hip in the extended and abducted position.  The final 8 standard Tri Lock stem was   chosen and it was impacted down to the level of neck cut.  Based on this   and the trial reduction, a 36+1.5 delta ceramic ball was chosen and   impacted onto a clean and dry trunnion, and the hip was reduced.  The   hip had been irrigated throughout the case again at this point.  I did   reapproximate the superior capsular leaflet to the anterior leaflet   using #1 Vicryl, placed a medium Hemovac drain deep.  The fascia of the   tensor fascia lata muscle was then reapproximated using #1 Vicryl.  The   remaining wound was closed with 2-0 Vicryl and running 4-0 Monocryl.   The hip was cleaned, dried, and dressed  sterilely using Dermabond and  Aquacel dressing.  Drain site dressed separately.  She was then brought   to recovery room in stable condition tolerating the procedure well.    Lanney Gins, PA-C was present for the entirety of the case involved from   preoperative positioning, perioperative retractor management, general   facilitation of the case, as well as primary wound closure as assistant.            Madlyn Frankel Charlann Boxer, M.D.            MDO/MEDQ  D:  04/27/2011  T:  04/27/2011  Job:  [id number]      Electronically Signed by Durene Romans M.D. on 05/03/2011 09:15:38 AM

## 2011-10-12 NOTE — Transfer of Care (Signed)
Immediate Anesthesia Transfer of Care Note  Patient: [name]  Procedure(s) Performed: Procedure(s) (LRB): TOTAL HIP ARTHROPLASTY ANTERIOR APPROACH (Right)  Patient Location: PACU  Anesthesia Type: General  Level of Consciousness: awake, alert  and oriented  Airway & Oxygen Therapy: Patient Spontanous Breathing and Patient connected to face mask oxygen  Post-op Assessment: Report given to PACU RN and Post -op Vital signs reviewed and stable  Post vital signs: Reviewed and unstable  Complications: No apparent anesthesia complications

## 2011-10-12 NOTE — Anesthesia Postprocedure Evaluation (Signed)
  Anesthesia Post-op Note  Patient: [name]  Procedure(s) Performed: Procedure(s) (LRB): TOTAL HIP ARTHROPLASTY ANTERIOR APPROACH (Right)  Patient Location: PACU  Anesthesia Type: General  Level of Consciousness: awake and alert   Airway and Oxygen Therapy: Patient Spontanous Breathing  Post-op Pain: mild  Post-op Assessment: Post-op Vital signs reviewed, Patient's Cardiovascular Status Stable, Respiratory Function Stable, Patent Airway and No signs of Nausea or vomiting  Post-op Vital Signs: stable  Complications: No apparent anesthesia complications

## 2011-10-12 NOTE — Interval H&P Note (Signed)
History and Physical Interval Note:  10/12/2011 7:00 AM  [name]  has presented today for surgery, with the diagnosis of osteoarthritis of the Right Hip  The various methods of treatment have been discussed with the patient and family. After consideration of risks, benefits and other options for treatment, the patient has consented to  Procedure(s) (LRB): RIGHT TOTAL HIP ARTHROPLASTY ANTERIOR APPROACH (Right) as a surgical intervention .  The patients' history has been reviewed, patient examined, no change in status, stable for surgery.  I have reviewed the patients' chart and labs.  Questions were answered to the patient's satisfaction.     Shelda Pal

## 2011-10-13 LAB — BASIC METABOLIC PANEL
CO2: 24 mEq/L (ref 19–32)
Calcium: 8.5 mg/dL (ref 8.4–10.5)
Chloride: 110 mEq/L (ref 96–112)
Creatinine, Ser: 0.85 mg/dL (ref 0.50–1.35)
Glucose, Bld: 100 mg/dL — ABNORMAL HIGH (ref 70–99)

## 2011-10-13 LAB — CBC
Hemoglobin: 12.8 g/dL — ABNORMAL LOW (ref 13.0–17.0)
MCH: 30.5 pg (ref 26.0–34.0)
MCV: 93.8 fL (ref 78.0–100.0)
RBC: 4.2 MIL/uL — ABNORMAL LOW (ref 4.22–5.81)

## 2011-10-13 NOTE — Progress Notes (Signed)
OT Screen Order received, chart reviewed. Spoke briefly with pt and mom who state pt has all necessary DME and will have prn A at home from family. Pt presents with no OT needs at this time. Will sign off.  Garrel Ridgel, OTR/L  Pager [phone] 10/13/2011

## 2011-10-13 NOTE — Progress Notes (Signed)
CARE MANAGEMENT NOTE 10/13/2011  Patient:  [name], [name]   Account Number:  [id number]  Date Initiated:  10/13/2011  Documentation initiated by:  Colleen Can  Subjective/Objective Assessment:   dx  avascular necrosis hip -right; total hip replacemnt-anterior approach     Action/Plan:   Cm spoke with patient and his mother. Plans are for patient to return to his home in [facility] where his mother will be caregiver. Patient already has DME from previous surgeries, HH choice is [place].   Anticipated DC Date:  10/14/2011   Anticipated DC Plan:  HOME W HOME HEALTH SERVICES  In-house referral  NA      DC Planning Services  CM consult      [facility] Choice  HOME HEALTH   Choice offered to / List presented to:  C-1 Patient   DME arranged  NA      DME agency  NA     HH arranged  HH-2 PT      [facility] agency  [facility]   Status of service:  Completed, signed off Medicare Important Message given?  NA - LOS <3 / Initial given by admissions
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
## 2011-10-13 NOTE — Progress Notes (Signed)
Subjective: 1 Day Post-Op Procedure(s) (LRB): TOTAL HIP ARTHROPLASTY ANTERIOR APPROACH (Right)   Patient reports pain as mild, pain well controlled with medication. No other events.  Objective:   VITALS:   Filed Vitals:   10/13/11 0700  BP: 104/65  Pulse: 79  Temp: 97.9 F (36.6 C)  Resp: 16    Neurovascular intact Dorsiflexion/Plantar flexion intact Incision: dressing C/D/I No cellulitis present Compartment soft  LABS  Basename 10/13/11 0406  HGB 12.8*  HCT 39.4  WBC 11.0*  PLT 165     Basename 10/13/11 0406  NA 139  K 4.2  BUN 6  CREATININE 0.85  GLUCOSE 100*     Assessment/Plan: 1 Day Post-Op Procedure(s) (LRB): TOTAL HIP ARTHROPLASTY ANTERIOR APPROACH (Right)   Foley cath d/c'ed HV drain d/c'ed Advance diet Up with therapy D/C IV fluids Discharge home with home health to home, if he continues to do well.   Anastasio Auerbach Hendry Speas   PAC  10/13/2011, 9:01 AM

## 2011-10-13 NOTE — Progress Notes (Signed)
Physical Therapy Treatment Patient Details Name: [name] MRN: [id number] DOB: 1985/07/24 Today's Date: 10/13/2011  PT Assessment/Plan  PT - Assessment/Plan Comments on Treatment Session: Pt progressing well with ambulation, however continues to have increased c/o pain when walking.  Pt to D/C tomorrow, pt/family stated that he may want to practice a couple of steps.   PT Plan: Discharge plan remains appropriate PT Frequency: 7X/week Follow Up Recommendations: Home health PT Equipment Recommended: None recommended by PT PT Goals  Acute Rehab PT Goals PT Goal Formulation: With patient/family Time For Goal Achievement: 3 days Pt will go Sit to Stand: with modified independence PT Goal: Sit to Stand - Progress: Progressing toward goal Pt will go Stand to Sit: with modified independence PT Goal: Stand to Sit - Progress: Progressing toward goal Pt will Ambulate: >150 feet;with supervision;with least restrictive assistive device PT Goal: Ambulate - Progress: Progressing toward goal Pt will Perform Home Exercise Program: Independently PT Goal: Perform Home Exercise Program - Progress: Progressing toward goal  PT Treatment Precautions/Restrictions  Precautions Precautions: Fall Required Braces or Orthoses: No Restrictions Weight Bearing Restrictions: No RLE Weight Bearing: Weight bearing as tolerated Mobility (including Balance) Transfers Transfers: Yes Sit to Stand: 4: Min assist;With upper extremity assist;With armrests;From chair/3-in-1 Sit to Stand Details (indicate cue type and reason): Min/guard for safety with cues for hand placement  Stand to Sit: 5: Supervision;With upper extremity assist;To elevated surface;To bed Stand to Sit Details: Supervision for safety with cues for positioning and hand placement  Ambulation/Gait Ambulation/Gait: Yes Ambulation/Gait Assistance: 4: Min assist Ambulation/Gait Assistance Details (indicate cue type and reason): Min/guard for  steadying with cues for sequencing with pt again starting with step to and transitioning to step through.  Continues to require re-directing for attending to task.  Ambulation Distance (Feet): 200 Feet Assistive device: Rolling walker Gait Pattern: Step-to pattern;Step-through pattern;Trunk flexed    Exercise  Total Joint Exercises Hip ABduction/ADduction: AROM;Right;10 reps;Standing Knee Flexion: AROM;Right;10 reps;Standing Marching in Standing: AROM;Right;10 reps;Standing End of Session PT - End of Session Equipment Utilized During Treatment: Gait belt;Left ankle foot orthosis Activity Tolerance: Patient tolerated treatment well;Patient limited by pain Patient left: in bed;with call bell in reach;with family/visitor present General Behavior During Session: [facility] for tasks performed Cognition: [facility] for tasks performed  Page, Meribeth Mattes 10/13/2011, 5:36 PM

## 2011-10-14 LAB — CBC
HCT: 39.3 % (ref 39.0–52.0)
MCH: 30 pg (ref 26.0–34.0)
MCV: 94.2 fL (ref 78.0–100.0)
Platelets: 145 10*3/uL — ABNORMAL LOW (ref 150–400)
RBC: 4.17 MIL/uL — ABNORMAL LOW (ref 4.22–5.81)
RDW: 14.2 % (ref 11.5–15.5)

## 2011-10-14 LAB — BASIC METABOLIC PANEL
BUN: 5 mg/dL — ABNORMAL LOW (ref 6–23)
CO2: 24 mEq/L (ref 19–32)
Calcium: 9 mg/dL (ref 8.4–10.5)
Creatinine, Ser: 0.89 mg/dL (ref 0.50–1.35)

## 2011-10-14 MED ORDER — DSS 100 MG PO CAPS: 100.0000 mg | ORAL_CAPSULE | Freq: Two times a day (BID) | ORAL | Status: AC

## 2011-10-14 MED ORDER — METHOCARBAMOL 500 MG PO TABS: 500.0000 mg | ORAL_TABLET | Freq: Four times a day (QID) | ORAL | Status: AC | PRN

## 2011-10-14 MED ORDER — DIPHENHYDRAMINE HCL 25 MG PO CAPS: 25.0000 mg | ORAL_CAPSULE | Freq: Four times a day (QID) | ORAL | Status: DC | PRN

## 2011-10-14 MED ORDER — FERROUS SULFATE 325 (65 FE) MG PO TABS: 325.0000 mg | ORAL_TABLET | Freq: Two times a day (BID) | ORAL | Status: DC

## 2011-10-14 MED ORDER — POLYETHYLENE GLYCOL 3350 17 G PO PACK: 17.0000 g | PACK | Freq: Two times a day (BID) | ORAL | Status: AC

## 2011-10-14 MED ORDER — ASPIRIN EC 325 MG PO TBEC: 325.0000 mg | DELAYED_RELEASE_TABLET | Freq: Two times a day (BID) | ORAL | Status: AC

## 2011-10-14 MED ORDER — HYDROCODONE-ACETAMINOPHEN 7.5-325 MG PO TABS: 1.0000 | ORAL_TABLET | ORAL | Status: DC | PRN

## 2011-10-14 NOTE — Progress Notes (Signed)
CARE MANAGEMENT NOTE 10/14/2011  Patient:  [name], [name]   Account Number:  [id number]  Date Initiated:  10/13/2011  Documentation initiated by:  Colleen Can  Subjective/Objective Assessment:   dx  avascular necrosis hip -right; total hip replacemnt-anterior approach     Action/Plan:   Cm spoke with patient and his mother. Plans are for patient to return to his home in [place] where his mother will be caregiver. Patient already has DME from previous surgeries, HH choice is [place].   Anticipated DC Date:  10/14/2011   Anticipated DC Plan:  HOME W HOME HEALTH SERVICES  In-house referral  NA      DC Planning Services  CM consult      [facility] Choice  HOME HEALTH   Choice offered to / List presented to:  C-1 Patient   DME arranged  NA      DME agency  NA     HH arranged  HH-2 PT      [facility] agency  [facility]   Status of service:  Completed, signed off Medicare Important Message given?  NA - LOS <3 / Initial given by admissions (If response is "NO", the following Medicare IM given date fields will be blank) Date Medicare IM given:   Date Additional Medicare IM given:    Discharge Disposition:  HOME W HOME HEALTH SERVICES Comments:  10/14/2011 Raynelle Bring BSN CCM [phone] List of hh agencies on shadow chart. Pt discharged today with [facility] services to start tomorrow 10/15/2011.

## 2011-10-14 NOTE — Progress Notes (Signed)
Physical Therapy Treatment Patient Details Name: [name] MRN: [id number] DOB: [birth year] Today's Date: 10/14/2011  PT Assessment/Plan  PT - Assessment/Plan Comments on Treatment Session: Pt's mother present during stair training. Pt/mother demonstrate understanding of correct technique for stairs. REady to DC home from PT standpoint. PT Plan: Discharge plan remains appropriate PT Frequency: 7X/week Recommendations for Other Services: OT consult Follow Up Recommendations: Home health PT Equipment Recommended: None recommended by PT PT Goals  Acute Rehab PT Goals PT Goal Formulation: With patient/family Time For Goal Achievement: 3 days Pt will go Supine/Side to Sit: with modified independence Pt will go Sit to Stand: with modified independence PT Goal: Sit to Stand - Progress: Met Pt will go Stand to Sit: with modified independence PT Goal: Stand to Sit - Progress: Met Pt will Ambulate: >150 feet;with supervision;with least restrictive assistive device PT Goal: Ambulate - Progress: Met Pt will Go Up / Down Stairs: 1-2 stairs;with supervision;with least restrictive assistive device PT Goal: Up/Down Stairs - Progress: Partly met  PT Treatment Precautions/Restrictions  Precautions Precautions: Fall Required Braces or Orthoses: No Restrictions Weight Bearing Restrictions: No RLE Weight Bearing: Weight bearing as tolerated Mobility (including Balance) Bed Mobility Bed Mobility: No Transfers Transfers: Yes Sit to Stand: 6: Modified independent (Device/Increase time);With armrests;From chair/3-in-1 Sit to Stand Details (indicate cue type and reason): VCs for hand placement Stand to Sit: 6: Modified independent (Device/Increase time);To chair/3-in-1;With armrests Stand to Sit Details: VCs for hand placement Ambulation/Gait Ambulation/Gait: Yes Ambulation/Gait Assistance: 6: Modified independent (Device/Increase time) Ambulation/Gait Assistance Details (indicate cue type  and reason): supervision for safety Ambulation Distance (Feet): 60 Feet Assistive device: Rolling walker Gait Pattern: Step-through pattern Gait velocity: WFL Stairs: Yes Stairs Assistance: 4: Min assist Stairs Assistance Details (indicate cue type and reason): min A to steady RW Stair Management Technique: No rails;Backwards;With walker Number of Stairs: 2  Wheelchair Mobility Wheelchair Mobility: No    Exercise    End of Session PT - End of Session Equipment Utilized During Treatment: Gait belt;Left ankle foot orthosis Activity Tolerance: Patient tolerated treatment well Patient left: in chair;with call bell in reach;with family/visitor present Nurse Communication: Mobility status for transfers;Mobility status for ambulation General Behavior During Session: [facility] for tasks performed Cognition: [facility] for tasks performed  Tamala Ser 10/14/2011, 10:14 AM

## 2011-10-14 NOTE — Progress Notes (Signed)
Subjective: 2 Days Post-Op Procedure(s) (LRB): TOTAL HIP ARTHROPLASTY ANTERIOR APPROACH (Right)   Patient reports pain as mild. Pain well controlled with medication. No events throughout the night. Ready to be discharged home.  Objective:   VITALS:   Filed Vitals:   10/14/11 0620  BP: 123/77  Pulse: 104  Temp: 98.2 F (36.8 C)  Resp: 16    Neurovascular intact Dorsiflexion/Plantar flexion intact Incision: dressing C/D/I No cellulitis present Compartment soft  LABS  Basename 10/14/11 0440 10/13/11 0406  HGB 12.5* 12.8*  HCT 39.3 39.4  WBC 11.9* 11.0*  PLT 145* 165     Basename 10/14/11 0440 10/13/11 0406  NA 141 139  K 4.2 4.2  BUN 5* 6  CREATININE 0.89 0.85  GLUCOSE 100* 100*     Assessment/Plan: 2 Days Post-Op Procedure(s) (LRB): TOTAL HIP ARTHROPLASTY ANTERIOR APPROACH (Right)   Up with therapy Discharge home with home health today Follow up in 2 weeks at [facility].  Follow-up Information    Follow up with [name],Shakur Lembo D in 2 weeks.   Contact information:   [facility] [address] [place] [address] [phone]          Anastasio Auerbach. Renso Swett   PAC  10/14/2011, 9:24 AM

## 2011-10-14 NOTE — Progress Notes (Signed)
Physical Therapy Treatment Patient Details Name: JENNA ARDOIN MRN: [id number] DOB: Nov 21, 1985 Today's Date: 10/14/2011  PT Assessment/Plan  PT - Assessment/Plan Comments on Treatment Session: Pt doing well with ambulation. Would like to practice stairs after breakfast. PT Plan: Discharge plan remains appropriate PT Frequency: 7X/week Recommendations for Other Services: OT consult Follow Up Recommendations: Home health PT Equipment Recommended: None recommended by PT PT Goals  Acute Rehab PT Goals PT Goal Formulation: With patient/family Time For Goal Achievement: 3 days Pt will go Supine/Side to Sit: with modified independence Pt will go Sit to Stand: with supervision PT Goal: Sit to Stand - Progress: Progressing toward goal Pt will go Stand to Sit: with modified independence PT Goal: Stand to Sit - Progress: Progressing toward goal Pt will Ambulate: >150 feet;with supervision;with least restrictive assistive device PT Goal: Ambulate - Progress: Met Pt will Go Up / Down Stairs: 1-2 stairs;with supervision;with least restrictive assistive device PT Goal: Up/Down Stairs - Progress: Not met  PT Treatment Precautions/Restrictions  Precautions Precautions: Fall Required Braces or Orthoses: No Restrictions Weight Bearing Restrictions: No RLE Weight Bearing: Weight bearing as tolerated Mobility (including Balance) Bed Mobility Bed Mobility: No Transfers Transfers: Yes Sit to Stand: 5: Supervision;With upper extremity assist;With armrests;From chair/3-in-1 Sit to Stand Details (indicate cue type and reason): VCs for hand placement Stand to Sit: 5: Supervision;To chair/3-in-1;With upper extremity assist Stand to Sit Details: VCs for hand placement Ambulation/Gait Ambulation/Gait: Yes Ambulation/Gait Assistance: 5: Supervision Ambulation/Gait Assistance Details (indicate cue type and reason): supervision for safety Ambulation Distance (Feet): 200 Feet Assistive device:  Rolling walker Gait Pattern: Step-through pattern Gait velocity: WFL Stairs: No Wheelchair Mobility Wheelchair Mobility: No    Exercise    End of Session PT - End of Session Equipment Utilized During Treatment: Gait belt;Left ankle foot orthosis Activity Tolerance: Patient tolerated treatment well Patient left: in chair;with call bell in reach;with family/visitor present Nurse Communication: Mobility status for transfers;Mobility status for ambulation General Behavior During Session: [facility] for tasks performed Cognition: [facility] for tasks performed  Tamala Ser 10/14/2011, 10:10 AM [phone]
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
## 2011-10-16 DIAGNOSIS — IMO0001 Reserved for inherently not codable concepts without codable children: Secondary | ICD-10-CM | POA: Diagnosis not present

## 2011-10-16 DIAGNOSIS — M216X9 Other acquired deformities of unspecified foot: Secondary | ICD-10-CM | POA: Diagnosis not present

## 2011-10-16 DIAGNOSIS — K589 Irritable bowel syndrome without diarrhea: Secondary | ICD-10-CM | POA: Diagnosis not present

## 2011-10-16 DIAGNOSIS — Z471 Aftercare following joint replacement surgery: Secondary | ICD-10-CM | POA: Diagnosis not present

## 2011-10-16 DIAGNOSIS — T84069A Wear of articular bearing surface of unspecified internal prosthetic joint, initial encounter: Secondary | ICD-10-CM | POA: Diagnosis not present

## 2011-10-16 DIAGNOSIS — H5440 Blindness, one eye, unspecified eye: Secondary | ICD-10-CM | POA: Diagnosis not present

## 2011-10-18 DIAGNOSIS — Z471 Aftercare following joint replacement surgery: Secondary | ICD-10-CM | POA: Diagnosis not present

## 2011-10-18 DIAGNOSIS — H5440 Blindness, one eye, unspecified eye: Secondary | ICD-10-CM | POA: Diagnosis not present

## 2011-10-18 DIAGNOSIS — IMO0001 Reserved for inherently not codable concepts without codable children: Secondary | ICD-10-CM | POA: Diagnosis not present

## 2011-10-18 DIAGNOSIS — K589 Irritable bowel syndrome without diarrhea: Secondary | ICD-10-CM | POA: Diagnosis not present

## 2011-10-18 DIAGNOSIS — M216X9 Other acquired deformities of unspecified foot: Secondary | ICD-10-CM | POA: Diagnosis not present

## 2011-10-20 DIAGNOSIS — Z471 Aftercare following joint replacement surgery: Secondary | ICD-10-CM | POA: Diagnosis not present

## 2011-10-20 DIAGNOSIS — M216X9 Other acquired deformities of unspecified foot: Secondary | ICD-10-CM | POA: Diagnosis not present

## 2011-10-20 DIAGNOSIS — K589 Irritable bowel syndrome without diarrhea: Secondary | ICD-10-CM | POA: Diagnosis not present

## 2011-10-20 DIAGNOSIS — H5440 Blindness, one eye, unspecified eye: Secondary | ICD-10-CM | POA: Diagnosis not present

## 2011-10-20 DIAGNOSIS — IMO0001 Reserved for inherently not codable concepts without codable children: Secondary | ICD-10-CM | POA: Diagnosis not present

## 2011-10-21 DIAGNOSIS — Z471 Aftercare following joint replacement surgery: Secondary | ICD-10-CM | POA: Diagnosis not present

## 2011-10-21 DIAGNOSIS — IMO0001 Reserved for inherently not codable concepts without codable children: Secondary | ICD-10-CM | POA: Diagnosis not present

## 2011-10-21 DIAGNOSIS — K589 Irritable bowel syndrome without diarrhea: Secondary | ICD-10-CM | POA: Diagnosis not present

## 2011-10-21 DIAGNOSIS — M216X9 Other acquired deformities of unspecified foot: Secondary | ICD-10-CM | POA: Diagnosis not present

## 2011-10-21 DIAGNOSIS — H5440 Blindness, one eye, unspecified eye: Secondary | ICD-10-CM | POA: Diagnosis not present

## 2011-10-26 ENCOUNTER — Encounter (HOSPITAL_COMMUNITY): Payer: Self-pay | Admitting: Orthopedic Surgery

## 2011-10-26 DIAGNOSIS — H5440 Blindness, one eye, unspecified eye: Secondary | ICD-10-CM | POA: Diagnosis not present

## 2011-10-26 DIAGNOSIS — M216X9 Other acquired deformities of unspecified foot: Secondary | ICD-10-CM | POA: Diagnosis not present

## 2011-10-26 DIAGNOSIS — IMO0001 Reserved for inherently not codable concepts without codable children: Secondary | ICD-10-CM | POA: Diagnosis not present

## 2011-10-26 DIAGNOSIS — Z471 Aftercare following joint replacement surgery: Secondary | ICD-10-CM | POA: Diagnosis not present

## 2011-10-26 DIAGNOSIS — K589 Irritable bowel syndrome without diarrhea: Secondary | ICD-10-CM | POA: Diagnosis not present

## 2011-10-28 DIAGNOSIS — H5440 Blindness, one eye, unspecified eye: Secondary | ICD-10-CM | POA: Diagnosis not present

## 2011-10-28 DIAGNOSIS — K589 Irritable bowel syndrome without diarrhea: Secondary | ICD-10-CM | POA: Diagnosis not present

## 2011-10-28 DIAGNOSIS — M216X9 Other acquired deformities of unspecified foot: Secondary | ICD-10-CM | POA: Diagnosis not present

## 2011-10-28 DIAGNOSIS — Z471 Aftercare following joint replacement surgery: Secondary | ICD-10-CM | POA: Diagnosis not present

## 2011-10-28 DIAGNOSIS — IMO0001 Reserved for inherently not codable concepts without codable children: Secondary | ICD-10-CM | POA: Diagnosis not present

## 2011-11-02 DIAGNOSIS — Z471 Aftercare following joint replacement surgery: Secondary | ICD-10-CM | POA: Diagnosis not present

## 2011-11-02 DIAGNOSIS — IMO0001 Reserved for inherently not codable concepts without codable children: Secondary | ICD-10-CM | POA: Diagnosis not present

## 2011-11-02 DIAGNOSIS — K589 Irritable bowel syndrome without diarrhea: Secondary | ICD-10-CM | POA: Diagnosis not present

## 2011-11-02 DIAGNOSIS — M216X9 Other acquired deformities of unspecified foot: Secondary | ICD-10-CM | POA: Diagnosis not present

## 2011-11-02 DIAGNOSIS — H5440 Blindness, one eye, unspecified eye: Secondary | ICD-10-CM | POA: Diagnosis not present

## 2011-11-04 DIAGNOSIS — M87059 Idiopathic aseptic necrosis of unspecified femur: Secondary | ICD-10-CM | POA: Diagnosis not present

## 2011-11-05 DIAGNOSIS — M216X9 Other acquired deformities of unspecified foot: Secondary | ICD-10-CM | POA: Diagnosis not present

## 2011-11-05 DIAGNOSIS — K589 Irritable bowel syndrome without diarrhea: Secondary | ICD-10-CM | POA: Diagnosis not present

## 2011-11-05 DIAGNOSIS — IMO0001 Reserved for inherently not codable concepts without codable children: Secondary | ICD-10-CM | POA: Diagnosis not present

## 2011-11-05 DIAGNOSIS — H5440 Blindness, one eye, unspecified eye: Secondary | ICD-10-CM | POA: Diagnosis not present

## 2011-11-05 DIAGNOSIS — Z471 Aftercare following joint replacement surgery: Secondary | ICD-10-CM | POA: Diagnosis not present

## 2011-11-06 ENCOUNTER — Encounter (HOSPITAL_COMMUNITY): Payer: Self-pay

## 2011-11-06 ENCOUNTER — Emergency Department (HOSPITAL_COMMUNITY): Payer: Medicare Other

## 2011-11-06 ENCOUNTER — Emergency Department (HOSPITAL_COMMUNITY)
Admission: EM | Admit: 2011-11-06 | Discharge: 2011-11-06 | Disposition: A | Discharge status: Home / Self Care | Payer: Medicare Other | Attending: Emergency Medicine | Admitting: Emergency Medicine

## 2011-11-06 DIAGNOSIS — R569 Unspecified convulsions: Secondary | ICD-10-CM | POA: Insufficient documentation

## 2011-11-06 DIAGNOSIS — K219 Gastro-esophageal reflux disease without esophagitis: Secondary | ICD-10-CM | POA: Insufficient documentation

## 2011-11-06 DIAGNOSIS — R4182 Altered mental status, unspecified: Secondary | ICD-10-CM | POA: Diagnosis not present

## 2011-11-06 DIAGNOSIS — G40909 Epilepsy, unspecified, not intractable, without status epilepticus: Secondary | ICD-10-CM | POA: Diagnosis not present

## 2011-11-06 DIAGNOSIS — Z79899 Other long term (current) drug therapy: Secondary | ICD-10-CM | POA: Diagnosis not present

## 2011-11-06 DIAGNOSIS — R404 Transient alteration of awareness: Secondary | ICD-10-CM | POA: Diagnosis not present

## 2011-11-06 DIAGNOSIS — Z9181 History of falling: Secondary | ICD-10-CM | POA: Diagnosis not present

## 2011-11-06 DIAGNOSIS — I499 Cardiac arrhythmia, unspecified: Secondary | ICD-10-CM | POA: Diagnosis not present

## 2011-11-06 LAB — POCT I-STAT, CHEM 8
Calcium, Ion: 1.36 mmol/L — ABNORMAL HIGH (ref 1.12–1.32)
Chloride: 111 mEq/L (ref 96–112)
HCT: 45 % (ref 39.0–52.0)
Hemoglobin: 15.3 g/dL (ref 13.0–17.0)
Potassium: 4.1 mEq/L (ref 3.5–5.1)

## 2011-11-06 MED ORDER — HYDROCODONE-ACETAMINOPHEN 7.5-325 MG PO TABS: 1.0000 | ORAL_TABLET | Freq: Four times a day (QID) | ORAL | Status: DC | PRN

## 2011-11-06 MED ORDER — LORAZEPAM 1 MG PO TABS: 2.0000 mg | ORAL_TABLET | Freq: Four times a day (QID) | ORAL | Status: AC | PRN

## 2011-11-06 MED ORDER — LORAZEPAM 2 MG PO TABS: 2.0000 mg | ORAL_TABLET | Freq: Four times a day (QID) | ORAL | Status: AC | PRN
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
## 2011-11-06 NOTE — ED Notes (Signed)
Patient transported to CT 

## 2011-11-06 NOTE — ED Notes (Signed)
Returned from radiology. 

## 2011-11-06 NOTE — ED Notes (Signed)
Pt removed from lsb and ccollar

## 2011-11-06 NOTE — Discharge Instructions (Signed)
Nonepileptic Seizures  Nonepileptic seizures look like true epileptic seizures. The difference between nonepileptic seizures and real seizures is that real seizures are caused by an electrical abnormality in the brain. Nonepileptic seizures have no medical cause. Nonepileptic seizures may look real to an untrained person. A neurologist can usually tell the difference between a real seizure and a nonepileptic seizure. Nonepileptic seizures may also be called pseudoseizures. They are more frequent in women.  CAUSES   In general, the patient is unaware that the movements are not real seizures. This disorder is caused by stress or emotional trauma. Patients often feel badly. Patients are sometimes accused of causing the seizure-like movements when they are not aware that their symptoms are due to stress. The nonepileptic seizures are real and frightening to patients with this disorder. Sometimes, nonepileptic seizures may be due to a person faking the symptoms to get something he or she wants.  DIAGNOSIS   The diagnosis requires the patient to be continuously monitored by:   EEG (electroencephalogram).   Video camera.  After an episode, the patient is asked about their awareness, memory, and feelings during the seizure. The family, if present, also discusses what they see. The EEG and clinical information allows the neurologist to determine if the seizures are related to abnormal electrical activity in the brain.  TREATMENT   Medicines may be stopped if the patient has been treated for a true seizure disorder. Patient counseling is usually begun. Depression and anxiety, if present, are treated. Counseling helps to resolve stress.   Document Released: 08/06/2005 Document Revised: 06/10/2011 Document Reviewed: 01/02/2009  [employer] Patient Information 2012 [employer]
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
## 2011-11-09 DIAGNOSIS — M216X9 Other acquired deformities of unspecified foot: Secondary | ICD-10-CM | POA: Diagnosis not present

## 2011-11-09 DIAGNOSIS — H5440 Blindness, one eye, unspecified eye: Secondary | ICD-10-CM | POA: Diagnosis not present

## 2011-11-09 DIAGNOSIS — K589 Irritable bowel syndrome without diarrhea: Secondary | ICD-10-CM | POA: Diagnosis not present

## 2011-11-09 DIAGNOSIS — Z471 Aftercare following joint replacement surgery: Secondary | ICD-10-CM | POA: Diagnosis not present

## 2011-11-09 DIAGNOSIS — IMO0001 Reserved for inherently not codable concepts without codable children: Secondary | ICD-10-CM | POA: Diagnosis not present

## 2011-11-10 DIAGNOSIS — Z87820 Personal history of traumatic brain injury: Secondary | ICD-10-CM | POA: Diagnosis not present

## 2011-11-10 DIAGNOSIS — G40209 Localization-related (focal) (partial) symptomatic epilepsy and epileptic syndromes with complex partial seizures, not intractable, without status epilepticus: Secondary | ICD-10-CM | POA: Diagnosis not present

## 2011-11-11 DIAGNOSIS — IMO0001 Reserved for inherently not codable concepts without codable children: Secondary | ICD-10-CM | POA: Diagnosis not present

## 2011-11-11 DIAGNOSIS — K589 Irritable bowel syndrome without diarrhea: Secondary | ICD-10-CM | POA: Diagnosis not present

## 2011-11-11 DIAGNOSIS — M216X9 Other acquired deformities of unspecified foot: Secondary | ICD-10-CM | POA: Diagnosis not present

## 2011-11-11 DIAGNOSIS — H5440 Blindness, one eye, unspecified eye: Secondary | ICD-10-CM | POA: Diagnosis not present

## 2011-11-11 DIAGNOSIS — Z471 Aftercare following joint replacement surgery: Secondary | ICD-10-CM | POA: Diagnosis not present

## 2011-11-15 DIAGNOSIS — Z471 Aftercare following joint replacement surgery: Secondary | ICD-10-CM | POA: Diagnosis not present

## 2011-11-15 DIAGNOSIS — H5440 Blindness, one eye, unspecified eye: Secondary | ICD-10-CM | POA: Diagnosis not present

## 2011-11-15 DIAGNOSIS — K589 Irritable bowel syndrome without diarrhea: Secondary | ICD-10-CM | POA: Diagnosis not present

## 2011-11-15 DIAGNOSIS — M216X9 Other acquired deformities of unspecified foot: Secondary | ICD-10-CM | POA: Diagnosis not present

## 2011-11-15 DIAGNOSIS — IMO0001 Reserved for inherently not codable concepts without codable children: Secondary | ICD-10-CM | POA: Diagnosis not present

## 2011-11-18 DIAGNOSIS — IMO0001 Reserved for inherently not codable concepts without codable children: Secondary | ICD-10-CM | POA: Diagnosis not present

## 2011-11-18 DIAGNOSIS — H5440 Blindness, one eye, unspecified eye: Secondary | ICD-10-CM | POA: Diagnosis not present

## 2011-11-18 DIAGNOSIS — K589 Irritable bowel syndrome without diarrhea: Secondary | ICD-10-CM | POA: Diagnosis not present

## 2011-11-18 DIAGNOSIS — Z471 Aftercare following joint replacement surgery: Secondary | ICD-10-CM | POA: Diagnosis not present

## 2011-11-18 DIAGNOSIS — M216X9 Other acquired deformities of unspecified foot: Secondary | ICD-10-CM | POA: Diagnosis not present

## 2011-11-23 DIAGNOSIS — IMO0001 Reserved for inherently not codable concepts without codable children: Secondary | ICD-10-CM | POA: Diagnosis not present

## 2011-11-23 DIAGNOSIS — Z471 Aftercare following joint replacement surgery: Secondary | ICD-10-CM | POA: Diagnosis not present

## 2011-11-23 DIAGNOSIS — K589 Irritable bowel syndrome without diarrhea: Secondary | ICD-10-CM | POA: Diagnosis not present

## 2011-11-23 DIAGNOSIS — H5440 Blindness, one eye, unspecified eye: Secondary | ICD-10-CM | POA: Diagnosis not present

## 2011-11-23 DIAGNOSIS — M216X9 Other acquired deformities of unspecified foot: Secondary | ICD-10-CM | POA: Diagnosis not present

## 2011-11-25 DIAGNOSIS — K589 Irritable bowel syndrome without diarrhea: Secondary | ICD-10-CM | POA: Diagnosis not present

## 2011-11-25 DIAGNOSIS — Z471 Aftercare following joint replacement surgery: Secondary | ICD-10-CM | POA: Diagnosis not present

## 2011-11-25 DIAGNOSIS — M216X9 Other acquired deformities of unspecified foot: Secondary | ICD-10-CM | POA: Diagnosis not present

## 2011-11-25 DIAGNOSIS — IMO0001 Reserved for inherently not codable concepts without codable children: Secondary | ICD-10-CM | POA: Diagnosis not present

## 2011-11-25 DIAGNOSIS — H5440 Blindness, one eye, unspecified eye: Secondary | ICD-10-CM | POA: Diagnosis not present

## 2011-11-30 DIAGNOSIS — IMO0001 Reserved for inherently not codable concepts without codable children: Secondary | ICD-10-CM | POA: Diagnosis not present

## 2011-11-30 DIAGNOSIS — Z471 Aftercare following joint replacement surgery: Secondary | ICD-10-CM | POA: Diagnosis not present

## 2011-11-30 DIAGNOSIS — K589 Irritable bowel syndrome without diarrhea: Secondary | ICD-10-CM | POA: Diagnosis not present

## 2011-11-30 DIAGNOSIS — H5440 Blindness, one eye, unspecified eye: Secondary | ICD-10-CM | POA: Diagnosis not present

## 2011-11-30 DIAGNOSIS — M216X9 Other acquired deformities of unspecified foot: Secondary | ICD-10-CM | POA: Diagnosis not present

## 2011-12-01 DIAGNOSIS — M87059 Idiopathic aseptic necrosis of unspecified femur: Secondary | ICD-10-CM | POA: Diagnosis not present

## 2011-12-02 DIAGNOSIS — Z471 Aftercare following joint replacement surgery: Secondary | ICD-10-CM | POA: Diagnosis not present

## 2011-12-02 DIAGNOSIS — H5440 Blindness, one eye, unspecified eye: Secondary | ICD-10-CM | POA: Diagnosis not present

## 2011-12-02 DIAGNOSIS — IMO0001 Reserved for inherently not codable concepts without codable children: Secondary | ICD-10-CM | POA: Diagnosis not present

## 2011-12-02 DIAGNOSIS — K589 Irritable bowel syndrome without diarrhea: Secondary | ICD-10-CM | POA: Diagnosis not present

## 2011-12-02 DIAGNOSIS — M216X9 Other acquired deformities of unspecified foot: Secondary | ICD-10-CM | POA: Diagnosis not present

## 2012-01-11 DIAGNOSIS — G40209 Localization-related (focal) (partial) symptomatic epilepsy and epileptic syndromes with complex partial seizures, not intractable, without status epilepticus: Secondary | ICD-10-CM | POA: Diagnosis not present

## 2012-01-13 DIAGNOSIS — M87059 Idiopathic aseptic necrosis of unspecified femur: Secondary | ICD-10-CM | POA: Diagnosis not present

## 2012-03-15 DIAGNOSIS — M8700 Idiopathic aseptic necrosis of unspecified bone: Secondary | ICD-10-CM | POA: Diagnosis not present

## 2012-03-15 DIAGNOSIS — M25569 Pain in unspecified knee: Secondary | ICD-10-CM | POA: Diagnosis not present

## 2012-05-03 DIAGNOSIS — G40909 Epilepsy, unspecified, not intractable, without status epilepticus: Secondary | ICD-10-CM | POA: Diagnosis not present

## 2012-05-03 DIAGNOSIS — Z79899 Other long term (current) drug therapy: Secondary | ICD-10-CM | POA: Diagnosis not present

## 2012-05-03 DIAGNOSIS — G8921 Chronic pain due to trauma: Secondary | ICD-10-CM | POA: Diagnosis not present

## 2012-05-03 DIAGNOSIS — F17200 Nicotine dependence, unspecified, uncomplicated: Secondary | ICD-10-CM | POA: Diagnosis not present

## 2012-05-03 DIAGNOSIS — Z23 Encounter for immunization: Secondary | ICD-10-CM | POA: Diagnosis not present

## 2012-05-03 DIAGNOSIS — K589 Irritable bowel syndrome without diarrhea: Secondary | ICD-10-CM | POA: Diagnosis not present

## 2012-07-31 DIAGNOSIS — G40209 Localization-related (focal) (partial) symptomatic epilepsy and epileptic syndromes with complex partial seizures, not intractable, without status epilepticus: Secondary | ICD-10-CM | POA: Diagnosis not present

## 2012-08-11 DIAGNOSIS — M8700 Idiopathic aseptic necrosis of unspecified bone: Secondary | ICD-10-CM | POA: Diagnosis not present

## 2012-08-21 ENCOUNTER — Ambulatory Visit
Admission: RE | Admit: 2012-08-21 | Discharge: 2012-08-21 | Disposition: A | Discharge status: Home / Self Care | Payer: Medicare Other | Source: Ambulatory Visit | Attending: Neurology | Admitting: Neurology

## 2012-08-21 ENCOUNTER — Other Ambulatory Visit: Payer: Self-pay | Admitting: Neurology

## 2012-08-21 ENCOUNTER — Emergency Department (HOSPITAL_COMMUNITY)
Admission: EM | Admit: 2012-08-21 | Discharge: 2012-08-21 | Disposition: A | Discharge status: Home / Self Care | Payer: Medicare Other | Attending: Emergency Medicine | Admitting: Emergency Medicine

## 2012-08-21 ENCOUNTER — Encounter (HOSPITAL_COMMUNITY): Payer: Self-pay | Admitting: Emergency Medicine

## 2012-08-21 DIAGNOSIS — Z792 Long term (current) use of antibiotics: Secondary | ICD-10-CM | POA: Diagnosis not present

## 2012-08-21 DIAGNOSIS — Z87820 Personal history of traumatic brain injury: Secondary | ICD-10-CM

## 2012-08-21 DIAGNOSIS — F17200 Nicotine dependence, unspecified, uncomplicated: Secondary | ICD-10-CM | POA: Diagnosis not present

## 2012-08-21 DIAGNOSIS — S1093XA Contusion of unspecified part of neck, initial encounter: Secondary | ICD-10-CM | POA: Insufficient documentation

## 2012-08-21 DIAGNOSIS — Z8739 Personal history of other diseases of the musculoskeletal system and connective tissue: Secondary | ICD-10-CM | POA: Insufficient documentation

## 2012-08-21 DIAGNOSIS — Z87828 Personal history of other (healed) physical injury and trauma: Secondary | ICD-10-CM | POA: Diagnosis not present

## 2012-08-21 DIAGNOSIS — M542 Cervicalgia: Secondary | ICD-10-CM | POA: Diagnosis not present

## 2012-08-21 DIAGNOSIS — Z8669 Personal history of other diseases of the nervous system and sense organs: Secondary | ICD-10-CM | POA: Diagnosis not present

## 2012-08-21 DIAGNOSIS — R569 Unspecified convulsions: Secondary | ICD-10-CM | POA: Diagnosis not present

## 2012-08-21 DIAGNOSIS — S0003XA Contusion of scalp, initial encounter: Secondary | ICD-10-CM | POA: Insufficient documentation

## 2012-08-21 DIAGNOSIS — G40209 Localization-related (focal) (partial) symptomatic epilepsy and epileptic syndromes with complex partial seizures, not intractable, without status epilepticus: Secondary | ICD-10-CM

## 2012-08-21 DIAGNOSIS — G40909 Epilepsy, unspecified, not intractable, without status epilepticus: Secondary | ICD-10-CM | POA: Diagnosis not present

## 2012-08-21 DIAGNOSIS — Z8719 Personal history of other diseases of the digestive system: Secondary | ICD-10-CM | POA: Diagnosis not present

## 2012-08-21 DIAGNOSIS — Y9389 Activity, other specified: Secondary | ICD-10-CM | POA: Insufficient documentation

## 2012-08-21 DIAGNOSIS — IMO0002 Reserved for concepts with insufficient information to code with codable children: Secondary | ICD-10-CM | POA: Diagnosis not present

## 2012-08-21 DIAGNOSIS — S4980XA Other specified injuries of shoulder and upper arm, unspecified arm, initial encounter: Secondary | ICD-10-CM | POA: Diagnosis not present

## 2012-08-21 DIAGNOSIS — M25519 Pain in unspecified shoulder: Secondary | ICD-10-CM | POA: Diagnosis not present

## 2012-08-21 DIAGNOSIS — F411 Generalized anxiety disorder: Secondary | ICD-10-CM | POA: Diagnosis not present

## 2012-08-21 DIAGNOSIS — S199XXA Unspecified injury of neck, initial encounter: Secondary | ICD-10-CM | POA: Diagnosis not present

## 2012-08-21 DIAGNOSIS — Y92009 Unspecified place in unspecified non-institutional (private) residence as the place of occurrence of the external cause: Secondary | ICD-10-CM | POA: Insufficient documentation

## 2012-08-21 DIAGNOSIS — Z79899 Other long term (current) drug therapy: Secondary | ICD-10-CM | POA: Insufficient documentation

## 2012-08-21 DIAGNOSIS — X58XXXA Exposure to other specified factors, initial encounter: Secondary | ICD-10-CM | POA: Insufficient documentation

## 2012-08-21 LAB — URINALYSIS, ROUTINE W REFLEX MICROSCOPIC
Bilirubin Urine: NEGATIVE
Glucose, UA: NEGATIVE mg/dL
Ketones, ur: NEGATIVE mg/dL
Leukocytes, UA: NEGATIVE
Protein, ur: NEGATIVE mg/dL
pH: 7 (ref 5.0–8.0)

## 2012-08-21 LAB — BASIC METABOLIC PANEL
CO2: 19 mEq/L (ref 19–32)
Calcium: 8.5 mg/dL (ref 8.4–10.5)
Creatinine, Ser: 1 mg/dL (ref 0.50–1.35)
GFR calc Af Amer: >90 mL/min (ref >90)
Sodium: 133 mEq/L — ABNORMAL LOW (ref 135–145)

## 2012-08-21 LAB — CBC WITH DIFFERENTIAL/PLATELET
Basophils Absolute: 0 10*3/uL (ref 0.0–0.1)
Basophils Relative: 0 % (ref 0–1)
HCT: 43.5 % (ref 39.0–52.0)
Lymphocytes Relative: 11 % — ABNORMAL LOW (ref 12–46)
MCHC: 33.6 g/dL (ref 30.0–36.0)
Monocytes Absolute: 0.9 10*3/uL (ref 0.1–1.0)
Neutro Abs: 11 10*3/uL — ABNORMAL HIGH (ref 1.7–7.7)
Neutrophils Relative %: 83 % — ABNORMAL HIGH (ref 43–77)
Platelets: 177 10*3/uL (ref 150–400)
RDW: 13.9 % (ref 11.5–15.5)
WBC: 13.3 10*3/uL — ABNORMAL HIGH (ref 4.0–10.5)

## 2012-08-21 LAB — RAPID URINE DRUG SCREEN, HOSP PERFORMED
Amphetamines: NOT DETECTED
Benzodiazepines: POSITIVE — AB
Cocaine: NOT DETECTED
Opiates: NOT DETECTED

## 2012-08-21 MED ORDER — SODIUM CHLORIDE 0.9 % IV SOLN
500.0000 mg | Freq: Once | INTRAVENOUS | Status: AC
  Administered 2012-08-21: 500 mg via INTRAVENOUS
  Filled 2012-08-21: qty 5

## 2012-08-21 MED ORDER — DIAZEPAM 10 MG RE GEL: 5.0000 mg | Freq: Once | RECTAL | Status: DC

## 2012-08-21 MED ORDER — LORAZEPAM 2 MG/ML IJ SOLN
1.0000 mg | Freq: Once | INTRAMUSCULAR | Status: AC
  Administered 2012-08-21: 1 mg via INTRAVENOUS
  Filled 2012-08-21: qty 1

## 2012-08-21 MED ORDER — TOPIRAMATE 25 MG PO TABS
200.0000 mg | ORAL_TABLET | Freq: Two times a day (BID) | ORAL | Status: DC
  Administered 2012-08-21: 200 mg via ORAL
  Filled 2012-08-21: qty 7
  Filled 2012-08-21: qty 1

## 2012-08-21 NOTE — ED Notes (Signed)
Pt saw Dr. Anne Hahn today

## 2012-08-21 NOTE — ED Notes (Signed)
Pt given urinal for urine sample 

## 2012-08-21 NOTE — ED Notes (Signed)
[id number]<WG> Expected date:<BR> Expected time:<BR> Means of arrival:<BR> Comments:<BR> seizure
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
## 2012-08-21 NOTE — ED Notes (Signed)
Patient is alert and oriented x3.  He was given DC instructions and follow up visit instructions.  Patient gave verbal understanding.  He was DC ambulatory under his own power to home.  V/S stable.  He was not showing any signs of distress on DC 
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
## 2012-08-22 LAB — TOPIRAMATE LEVEL: Topiramate Lvl: 20.3 ug/mL (ref 2.0–25.0)

## 2012-08-29 ENCOUNTER — Ambulatory Visit
Admission: RE | Admit: 2012-08-29 | Discharge: 2012-08-29 | Disposition: A | Discharge status: Home / Self Care | Payer: Medicare Other | Source: Ambulatory Visit | Attending: Family Medicine | Admitting: Family Medicine

## 2012-08-29 ENCOUNTER — Other Ambulatory Visit: Payer: Self-pay | Admitting: Family Medicine

## 2012-08-29 DIAGNOSIS — S0990XA Unspecified injury of head, initial encounter: Secondary | ICD-10-CM

## 2012-08-29 DIAGNOSIS — R569 Unspecified convulsions: Secondary | ICD-10-CM

## 2012-09-28 IMAGING — CT CT HEAD W/O CM
5 of 6 series · 18 of 47 positions shown, 19 images · non-contrast
Comparison: 01/21/2005

CT HEAD

CLINICAL DATA: sz,sz, fall. PREVIOUS TRAUMATIC BRAIN INJURY.

CT HEAD WITHOUT CONTRAST
CT CERVICAL SPINE WITHOUT CONTRAST
TECHNIQUE: Multidetector CT imaging of the head and cervical spine
was performed following the standard protocol without IV contrast.
Multiplanar CT image reconstructions of the cervical spine were
also generated.

[Series 3: head trauma 4.8 h37s · axial · 0.46mm/px · z∈[-58,+2]mm · 2 of 36 slices shown, 3 images]
[im 12/36  brain]
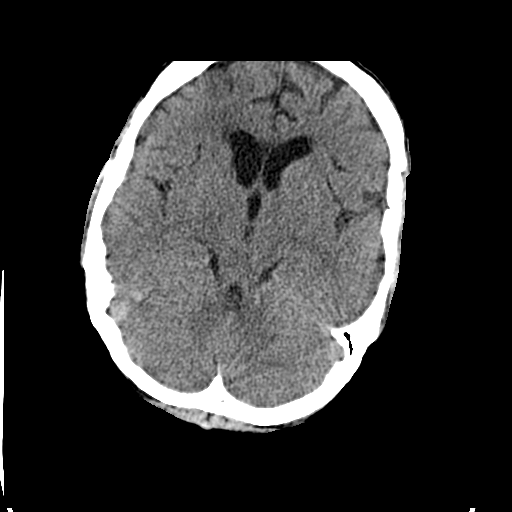
[im 12/36  bone]
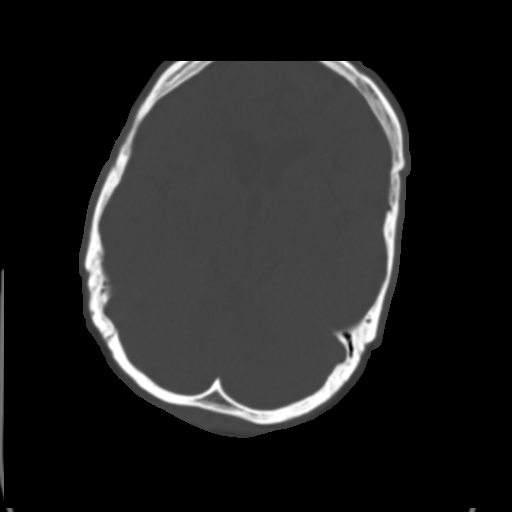
[im 24/36  brain]
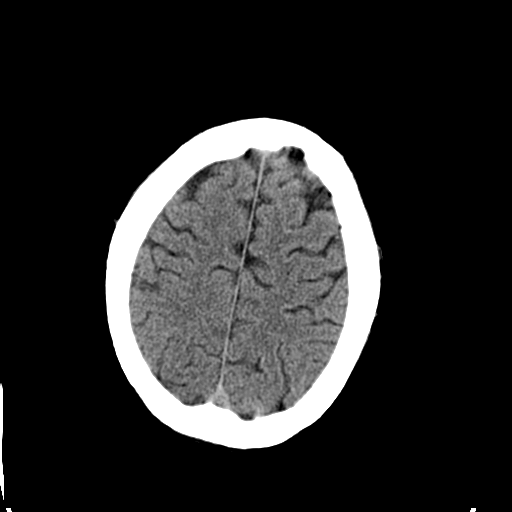

[Series 8: coronals · coronal · 0.38mm/px · 3 of 60 slices shown]
[im 20/60  brain]
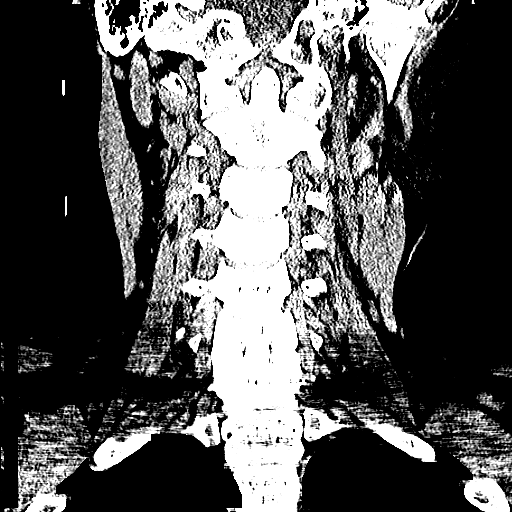
[im 27/60  brain]
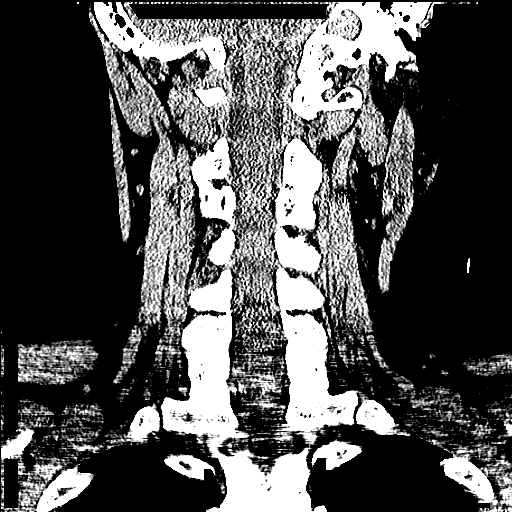
[im 33/60  brain]
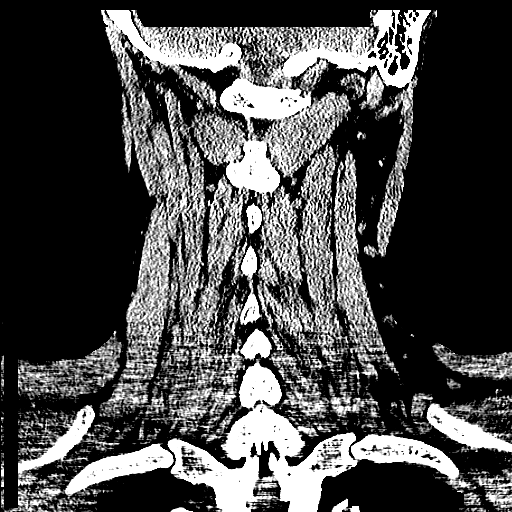

[Series 9: sagittals · sagittal · 0.43mm/px · 3 of 58 slices shown]
[im 20/58  brain]
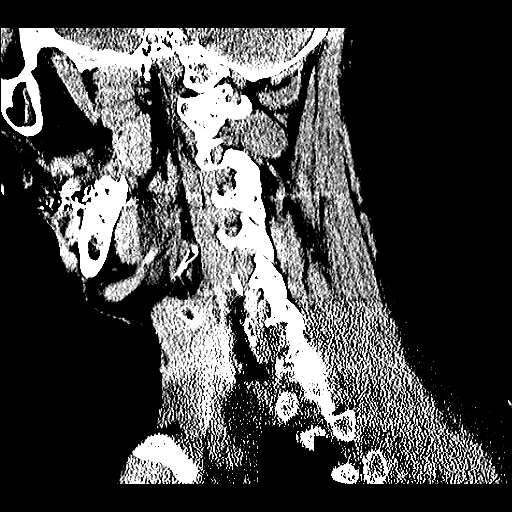
[im 29/58  brain]
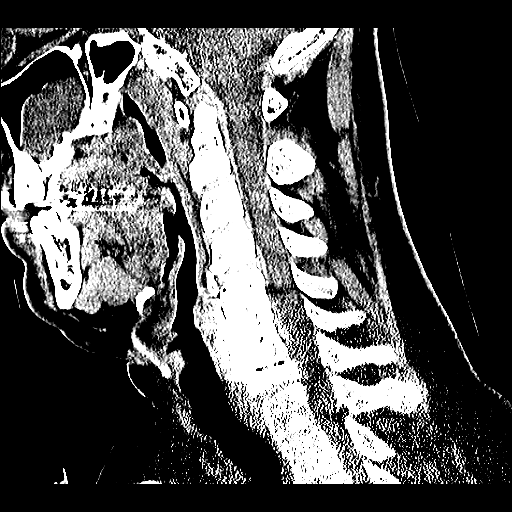
[im 39/58  brain]
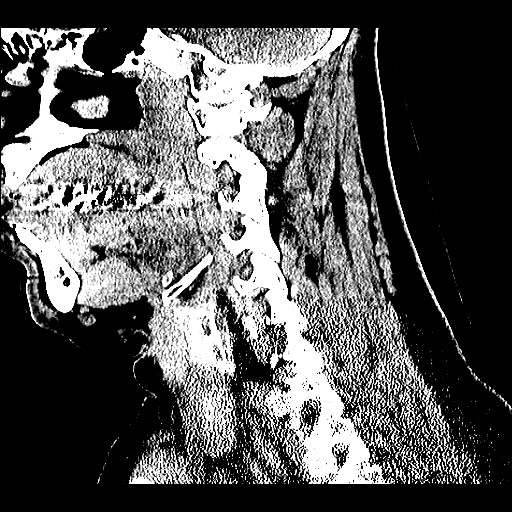

[Series 11: orthogonals · axial · 0.30mm/px · z∈[-321,-309]mm · 2 of 64 slices shown (1 of 2)]
[im 13/64  brain]
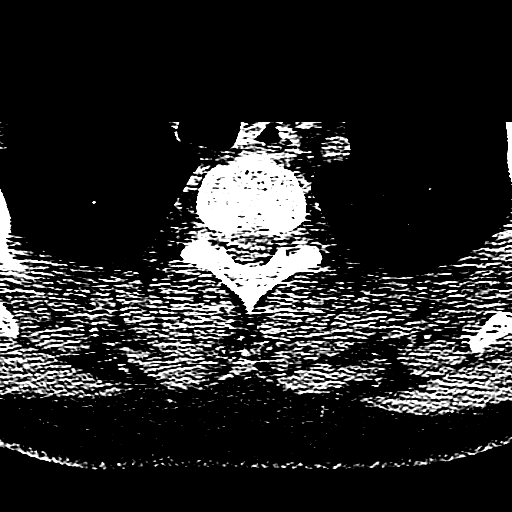
[im 26/64  brain]
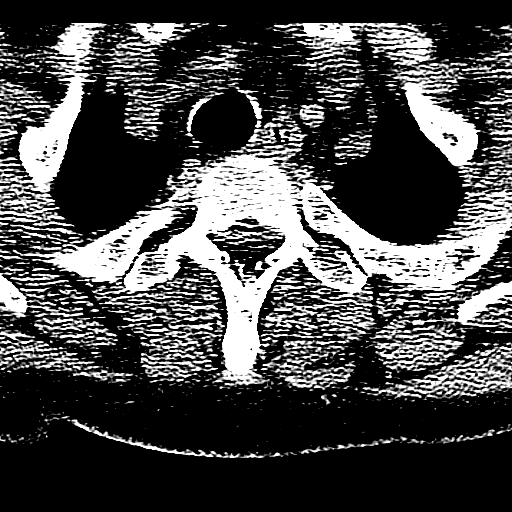

[Series 12: orthogonals · axial · 0.23mm/px · z∈[-231,-161]mm · 8 of 99 slices shown (2 of 2)]
[im 11/99  brain]
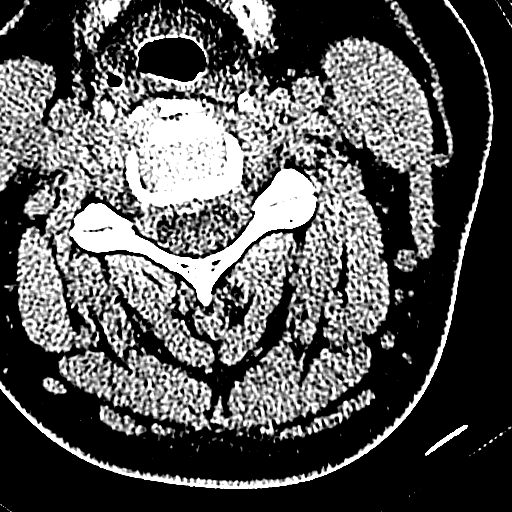
[im 22/99  brain]
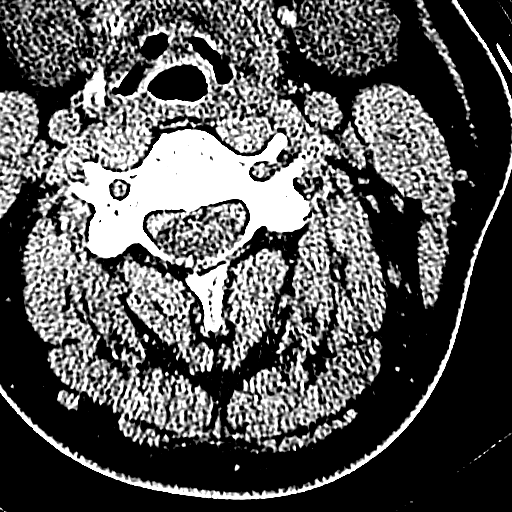
[im 33/99  brain]
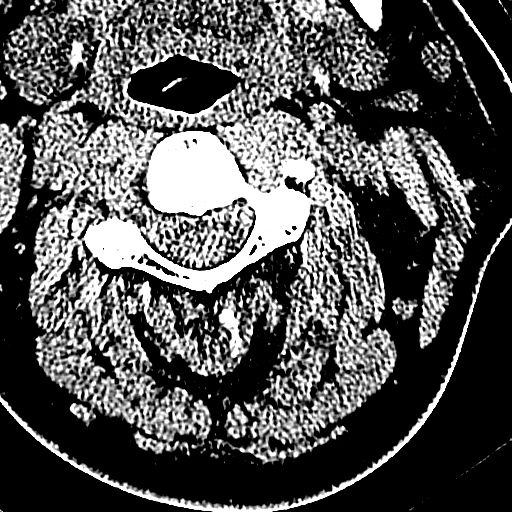
[im 44/99  brain]
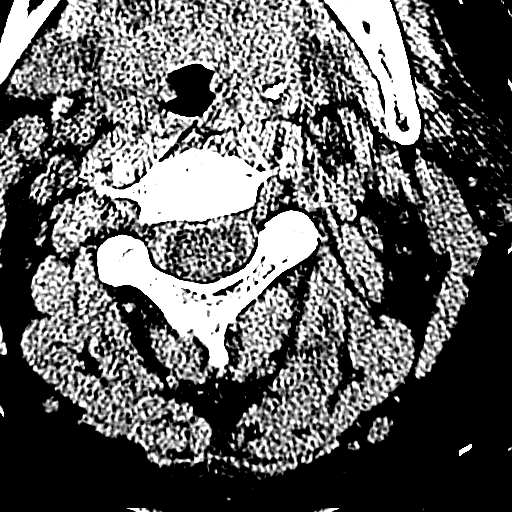
[im 55/99  brain]
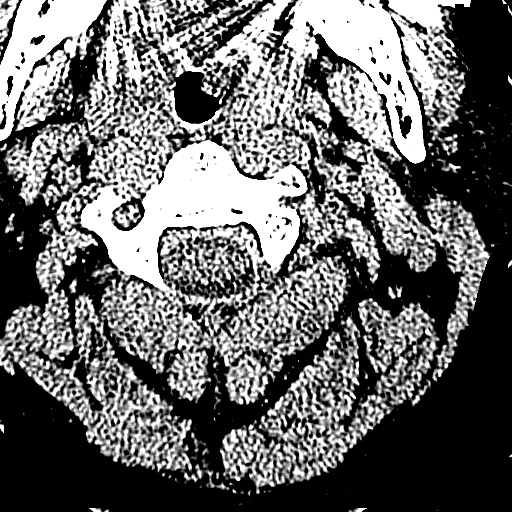
[im 66/99  brain]
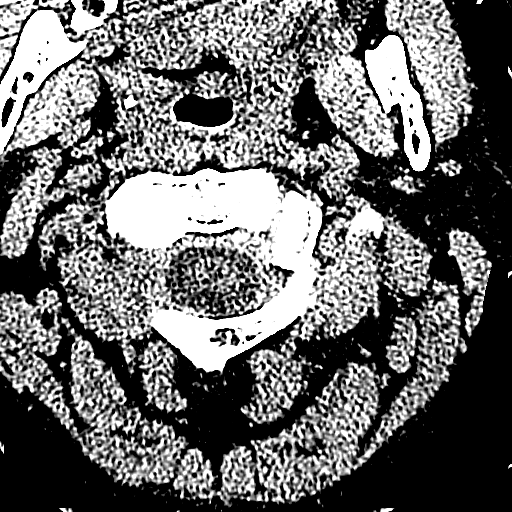
[im 77/99  brain]
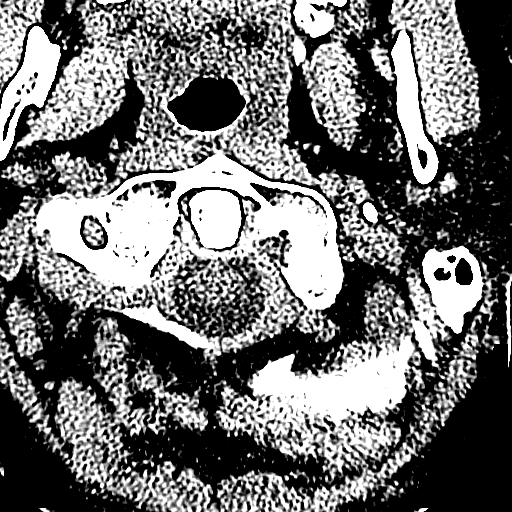
[im 88/99  brain]
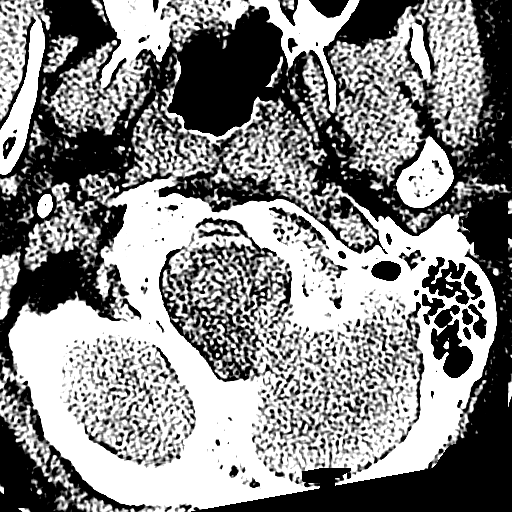

[18 of 47 positions shown; findings below may reference images not displayed]

FINDINGS: Changes of remote right craniotomy.  No acute calvarial
abnormality.  Old infarct in the right caudate nucleus.  Bifrontal
periventricular white matter hypoattenuation and mild atrophy.
Negative for acute intracranial hemorrhage, midline shift, mass, or
mass effect.  Acute infarct may be inapparent on noncontrast CT.
IMPRESSION: 1.  Negative for bleed or other acute intracranial process.
2.  Old postoperative and post-traumatic changes as above.

CT CERVICAL SPINE
FINDINGS: Previous ACDF C5-C7, hardware intact without surrounding
lucency, solid appearing interbody fusion across these levels.
Normal alignment.  No prevertebral soft tissue swelling.  Mild disc
bulges central bulges C3-4 and C4-5.  Negative for fracture,
dislocation, or other acute bony abnormality.
IMPRESSION: 1.  Negative for fracture or other acute abnormality.
2.  Postoperative and degenerative changes as above.

## 2012-11-21 ENCOUNTER — Encounter: Payer: Self-pay | Admitting: *Deleted

## 2012-11-23 ENCOUNTER — Encounter: Payer: Self-pay | Admitting: Nurse Practitioner

## 2012-11-23 ENCOUNTER — Other Ambulatory Visit: Payer: Self-pay

## 2012-11-23 ENCOUNTER — Ambulatory Visit (INDEPENDENT_AMBULATORY_CARE_PROVIDER_SITE_OTHER): Payer: Medicare Other | Admitting: Nurse Practitioner

## 2012-11-23 ENCOUNTER — Telehealth: Payer: Self-pay | Admitting: *Deleted

## 2012-11-23 VITALS — BP 88/59 | HR 59 | Ht 70.5 in | Wt 197.0 lb

## 2012-11-23 DIAGNOSIS — Z87820 Personal history of traumatic brain injury: Secondary | ICD-10-CM | POA: Diagnosis not present

## 2012-11-23 DIAGNOSIS — G40209 Localization-related (focal) (partial) symptomatic epilepsy and epileptic syndromes with complex partial seizures, not intractable, without status epilepticus: Secondary | ICD-10-CM | POA: Insufficient documentation

## 2012-11-23 MED ORDER — TOPIRAMATE 100 MG PO TABS: 100.0000 mg | ORAL_TABLET | Freq: Two times a day (BID) | ORAL | Status: DC

## 2012-11-23 MED ORDER — TOPIRAMATE 100 MG PO TABS: ORAL_TABLET | ORAL | Status: DC

## 2012-11-23 MED ORDER — LEVETIRACETAM 500 MG PO TABS: 500.0000 mg | ORAL_TABLET | Freq: Two times a day (BID) | ORAL | Status: DC

## 2012-11-23 NOTE — Telephone Encounter (Signed)
Would not allow refill at this time.  [name] is updating Rx.

## 2012-11-23 NOTE — Progress Notes (Signed)
I have read the note, and I agree with the clinical assessment and plan.  

## 2012-11-23 NOTE — Patient Instructions (Addendum)
Change Topamax to 100 mg, 2 AM, 3 PM Continue Keflex 500 twice a day, called in  Followup in 6 months

## 2012-11-23 NOTE — Telephone Encounter (Signed)
Patient seen today and mom wants to know if there would be a problem if the patient has his wisdom teeth removed. Please call the mom.
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
## 2012-11-24 NOTE — Telephone Encounter (Signed)
There should not be a problem, the dentist needs to know he has seizure disorder

## 2012-11-29 NOTE — Telephone Encounter (Signed)
Left message for mother that it shouldn't be a problem to have wisdom teeth removed, but dentist should be aware that patient has a seizure disorder, per Eber Jones.  Told to call with any further questions.

## 2012-11-30 ENCOUNTER — Telehealth: Payer: Self-pay | Admitting: Nurse Practitioner

## 2012-12-01 ENCOUNTER — Telehealth: Payer: Self-pay | Admitting: Nurse Practitioner

## 2012-12-01 MED ORDER — TOPIRAMATE 200 MG PO TABS: 200.0000 mg | ORAL_TABLET | Freq: Two times a day (BID) | ORAL | Status: DC

## 2012-12-01 NOTE — Telephone Encounter (Signed)
Called mom, the message left was incorrect , she said insurance will pay for both doses of Topamax. Orders updated.

## 2012-12-01 NOTE — Telephone Encounter (Signed)
Insurance will not cover the Topamax. The patient wants to speak to cm about taking something else.

## 2013-02-22 ENCOUNTER — Ambulatory Visit: Payer: Self-pay | Admitting: Nurse Practitioner

## 2013-03-15 ENCOUNTER — Other Ambulatory Visit: Payer: Self-pay

## 2013-03-15 MED ORDER — TOPIRAMATE 100 MG PO TABS: ORAL_TABLET | ORAL | Status: DC

## 2013-03-15 NOTE — Telephone Encounter (Signed)
Pharmacy requests 90 day Rx  

## 2013-04-09 ENCOUNTER — Telehealth: Payer: Self-pay | Admitting: Neurology

## 2013-04-09 NOTE — Telephone Encounter (Signed)
Spoke to mother and was able to schedule patient tomorrow with Dr. Anne Hahn at 12noon.  Preferred not to wait until Thurs when [name] was back.

## 2013-04-10 ENCOUNTER — Ambulatory Visit (INDEPENDENT_AMBULATORY_CARE_PROVIDER_SITE_OTHER): Payer: Medicare Other | Admitting: Neurology

## 2013-04-10 ENCOUNTER — Encounter: Payer: Self-pay | Admitting: Neurology

## 2013-04-10 ENCOUNTER — Telehealth: Payer: Self-pay | Admitting: Neurology

## 2013-04-10 VITALS — BP 108/72 | HR 67 | Wt 199.0 lb

## 2013-04-10 DIAGNOSIS — G40209 Localization-related (focal) (partial) symptomatic epilepsy and epileptic syndromes with complex partial seizures, not intractable, without status epilepticus: Secondary | ICD-10-CM | POA: Diagnosis not present

## 2013-04-10 DIAGNOSIS — Z87820 Personal history of traumatic brain injury: Secondary | ICD-10-CM | POA: Diagnosis not present

## 2013-04-10 MED ORDER — LORAZEPAM 2 MG PO TABS: 2.0000 mg | ORAL_TABLET | Freq: Four times a day (QID) | ORAL | Status: DC | PRN

## 2013-04-10 MED ORDER — LEVETIRACETAM 1000 MG PO TABS: 1000.0000 mg | ORAL_TABLET | Freq: Two times a day (BID) | ORAL | Status: DC

## 2013-04-10 NOTE — Patient Instructions (Signed)
With the keppra 500 mg, take one in the morning and 2 in the evening for one week, then take 2 tablets twice a day. Once one 1000 mg twice a day of keppra, start the 1000 mg tablets, taking one twice a day
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
## 2013-04-11 MED ORDER — LEVETIRACETAM 1000 MG PO TABS: 1000.0000 mg | ORAL_TABLET | Freq: Two times a day (BID) | ORAL | Status: DC

## 2013-04-11 NOTE — Telephone Encounter (Signed)
I will call in a small prescription for Keppra.

## 2013-04-11 NOTE — Telephone Encounter (Signed)
Pt needs keppra sent to the pharmacy.

## 2013-04-11 NOTE — Addendum Note (Signed)
Addended by: Stephanie Acre on: 04/11/2013 12:57 PM   Modules accepted: Orders

## 2013-04-17 DIAGNOSIS — Z23 Encounter for immunization: Secondary | ICD-10-CM | POA: Diagnosis not present

## 2013-05-03 DIAGNOSIS — G8921 Chronic pain due to trauma: Secondary | ICD-10-CM | POA: Diagnosis not present

## 2013-05-03 DIAGNOSIS — G40909 Epilepsy, unspecified, not intractable, without status epilepticus: Secondary | ICD-10-CM | POA: Diagnosis not present

## 2013-05-03 DIAGNOSIS — K589 Irritable bowel syndrome without diarrhea: Secondary | ICD-10-CM | POA: Diagnosis not present

## 2013-05-03 DIAGNOSIS — Z136 Encounter for screening for cardiovascular disorders: Secondary | ICD-10-CM | POA: Diagnosis not present

## 2013-05-03 DIAGNOSIS — Z23 Encounter for immunization: Secondary | ICD-10-CM | POA: Diagnosis not present

## 2013-05-03 DIAGNOSIS — F17200 Nicotine dependence, unspecified, uncomplicated: Secondary | ICD-10-CM | POA: Diagnosis not present

## 2013-05-03 DIAGNOSIS — Z79899 Other long term (current) drug therapy: Secondary | ICD-10-CM | POA: Diagnosis not present

## 2013-05-09 DIAGNOSIS — M25569 Pain in unspecified knee: Secondary | ICD-10-CM | POA: Diagnosis not present

## 2013-05-09 DIAGNOSIS — M25519 Pain in unspecified shoulder: Secondary | ICD-10-CM | POA: Diagnosis not present

## 2013-05-09 DIAGNOSIS — M25529 Pain in unspecified elbow: Secondary | ICD-10-CM | POA: Diagnosis not present

## 2013-05-24 ENCOUNTER — Ambulatory Visit: Payer: Medicare Other | Admitting: Nurse Practitioner

## 2013-06-20 ENCOUNTER — Other Ambulatory Visit: Payer: Self-pay | Admitting: Neurology

## 2013-07-02 ENCOUNTER — Other Ambulatory Visit: Payer: Self-pay | Admitting: Neurology

## 2013-07-08 NOTE — Telephone Encounter (Signed)
Patient requested the Rx be sent for 200mg , he prefers to cut tabs in half rather than taking multiple 100mg  tabs

## 2013-08-14 ENCOUNTER — Encounter (HOSPITAL_COMMUNITY): Payer: Self-pay | Admitting: Emergency Medicine

## 2013-08-14 ENCOUNTER — Emergency Department (HOSPITAL_COMMUNITY): Payer: Medicare Other

## 2013-08-14 ENCOUNTER — Emergency Department (HOSPITAL_COMMUNITY)
Admission: EM | Admit: 2013-08-14 | Discharge: 2013-08-14 | Disposition: A | Discharge status: Home / Self Care | Payer: Medicare Other | Attending: Emergency Medicine | Admitting: Emergency Medicine

## 2013-08-14 DIAGNOSIS — G40909 Epilepsy, unspecified, not intractable, without status epilepticus: Secondary | ICD-10-CM | POA: Diagnosis not present

## 2013-08-14 DIAGNOSIS — F17200 Nicotine dependence, unspecified, uncomplicated: Secondary | ICD-10-CM | POA: Insufficient documentation

## 2013-08-14 DIAGNOSIS — Z8719 Personal history of other diseases of the digestive system: Secondary | ICD-10-CM | POA: Diagnosis not present

## 2013-08-14 DIAGNOSIS — Z91040 Latex allergy status: Secondary | ICD-10-CM | POA: Insufficient documentation

## 2013-08-14 DIAGNOSIS — Z87828 Personal history of other (healed) physical injury and trauma: Secondary | ICD-10-CM | POA: Insufficient documentation

## 2013-08-14 DIAGNOSIS — W1809XA Striking against other object with subsequent fall, initial encounter: Secondary | ICD-10-CM | POA: Insufficient documentation

## 2013-08-14 DIAGNOSIS — Z79899 Other long term (current) drug therapy: Secondary | ICD-10-CM | POA: Diagnosis not present

## 2013-08-14 DIAGNOSIS — S0990XA Unspecified injury of head, initial encounter: Secondary | ICD-10-CM | POA: Diagnosis not present

## 2013-08-14 DIAGNOSIS — Y92009 Unspecified place in unspecified non-institutional (private) residence as the place of occurrence of the external cause: Secondary | ICD-10-CM | POA: Insufficient documentation

## 2013-08-14 DIAGNOSIS — W108XXA Fall (on) (from) other stairs and steps, initial encounter: Secondary | ICD-10-CM | POA: Insufficient documentation

## 2013-08-14 DIAGNOSIS — W19XXXA Unspecified fall, initial encounter: Secondary | ICD-10-CM

## 2013-08-14 DIAGNOSIS — Y939 Activity, unspecified: Secondary | ICD-10-CM | POA: Insufficient documentation

## 2013-08-14 MED ORDER — HYDROCODONE-ACETAMINOPHEN 5-325 MG PO TABS
1.0000 | ORAL_TABLET | Freq: Once | ORAL | Status: AC
  Administered 2013-08-14: 1 via ORAL
  Filled 2013-08-14: qty 1

## 2013-08-14 NOTE — ED Notes (Signed)
Pt. missed a step and fell at home this evening , hit his head against a wall , no LOC , reports pain at top and back of head. Alert and oriented / ambulatory.

## 2013-08-14 NOTE — ED Notes (Signed)
Called CT about patient's CT scan order.
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
## 2013-09-09 ENCOUNTER — Emergency Department (HOSPITAL_COMMUNITY)
Admission: EM | Admit: 2013-09-09 | Discharge: 2013-09-09 | Disposition: A | Discharge status: Home / Self Care | Payer: Medicare Other | Attending: Emergency Medicine | Admitting: Emergency Medicine

## 2013-09-09 ENCOUNTER — Emergency Department (HOSPITAL_COMMUNITY): Payer: Medicare Other

## 2013-09-09 ENCOUNTER — Encounter (HOSPITAL_COMMUNITY): Payer: Self-pay | Admitting: Emergency Medicine

## 2013-09-09 DIAGNOSIS — Z96649 Presence of unspecified artificial hip joint: Secondary | ICD-10-CM | POA: Insufficient documentation

## 2013-09-09 DIAGNOSIS — Z8719 Personal history of other diseases of the digestive system: Secondary | ICD-10-CM | POA: Insufficient documentation

## 2013-09-09 DIAGNOSIS — H5440 Blindness, one eye, unspecified eye: Secondary | ICD-10-CM | POA: Insufficient documentation

## 2013-09-09 DIAGNOSIS — IMO0002 Reserved for concepts with insufficient information to code with codable children: Secondary | ICD-10-CM

## 2013-09-09 DIAGNOSIS — F411 Generalized anxiety disorder: Secondary | ICD-10-CM | POA: Insufficient documentation

## 2013-09-09 DIAGNOSIS — G40909 Epilepsy, unspecified, not intractable, without status epilepticus: Secondary | ICD-10-CM | POA: Diagnosis not present

## 2013-09-09 DIAGNOSIS — Z91040 Latex allergy status: Secondary | ICD-10-CM | POA: Insufficient documentation

## 2013-09-09 DIAGNOSIS — T380X5A Adverse effect of glucocorticoids and synthetic analogues, initial encounter: Secondary | ICD-10-CM | POA: Insufficient documentation

## 2013-09-09 DIAGNOSIS — M948X9 Other specified disorders of cartilage, unspecified sites: Secondary | ICD-10-CM | POA: Diagnosis not present

## 2013-09-09 DIAGNOSIS — Z96659 Presence of unspecified artificial knee joint: Secondary | ICD-10-CM | POA: Insufficient documentation

## 2013-09-09 DIAGNOSIS — Z79899 Other long term (current) drug therapy: Secondary | ICD-10-CM | POA: Diagnosis not present

## 2013-09-09 DIAGNOSIS — Z87828 Personal history of other (healed) physical injury and trauma: Secondary | ICD-10-CM | POA: Diagnosis not present

## 2013-09-09 DIAGNOSIS — F17200 Nicotine dependence, unspecified, uncomplicated: Secondary | ICD-10-CM | POA: Insufficient documentation

## 2013-09-09 DIAGNOSIS — M25561 Pain in right knee: Secondary | ICD-10-CM

## 2013-09-09 DIAGNOSIS — M8708 Idiopathic aseptic necrosis of bone, other site: Secondary | ICD-10-CM | POA: Diagnosis not present

## 2013-09-09 DIAGNOSIS — M25569 Pain in unspecified knee: Secondary | ICD-10-CM | POA: Diagnosis not present

## 2013-09-09 MED ORDER — OXYCODONE-ACETAMINOPHEN 5-325 MG PO TABS
2.0000 | ORAL_TABLET | Freq: Once | ORAL | Status: AC
  Administered 2013-09-09: 2 via ORAL
  Filled 2013-09-09: qty 2

## 2013-09-09 MED ORDER — OXYCODONE-ACETAMINOPHEN 5-325 MG PO TABS: 1.0000 | ORAL_TABLET | ORAL | Status: DC | PRN
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
## 2013-09-09 NOTE — Discharge Instructions (Signed)
Take the prescribed medication as directed.  Do not drive while taking this medication. Follow-up with Dr. Charlann Boxerlin this week for further eval and management. Return to the ED for new or worsening symptoms.

## 2013-09-09 NOTE — ED Notes (Addendum)
Pt states he has had intermittent right knee pain for several months, worsening today.  Pt is able to straighten and flex the knee with some pain, but pain is especially exacerbated by standing and walking.  No swelling is noted.

## 2013-09-12 NOTE — ED Provider Notes (Signed)
Medical screening examination/treatment/procedure(s) were performed by non-physician practitioner and as supervising physician I was immediately available for consultation/collaboration.   EKG Interpretation None        Sawyer Kahan, MD 09/12/13 1743 

## 2013-09-13 DIAGNOSIS — M25569 Pain in unspecified knee: Secondary | ICD-10-CM | POA: Diagnosis not present

## 2013-10-05 ENCOUNTER — Other Ambulatory Visit: Payer: Self-pay | Admitting: Neurology

## 2013-10-10 ENCOUNTER — Ambulatory Visit (INDEPENDENT_AMBULATORY_CARE_PROVIDER_SITE_OTHER): Payer: Medicare Other | Admitting: Nurse Practitioner

## 2013-10-10 ENCOUNTER — Encounter (INDEPENDENT_AMBULATORY_CARE_PROVIDER_SITE_OTHER): Payer: Self-pay

## 2013-10-10 ENCOUNTER — Encounter: Payer: Self-pay | Admitting: Nurse Practitioner

## 2013-10-10 VITALS — BP 99/73 | HR 69 | Ht 70.0 in | Wt 205.0 lb

## 2013-10-10 DIAGNOSIS — G40209 Localization-related (focal) (partial) symptomatic epilepsy and epileptic syndromes with complex partial seizures, not intractable, without status epilepticus: Secondary | ICD-10-CM

## 2013-10-10 DIAGNOSIS — Z87820 Personal history of traumatic brain injury: Secondary | ICD-10-CM

## 2013-10-10 MED ORDER — LEVETIRACETAM 1000 MG PO TABS: 1000.0000 mg | ORAL_TABLET | Freq: Two times a day (BID) | ORAL | Status: DC
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
## 2013-10-10 NOTE — Patient Instructions (Signed)
Pt to continue Topamax at current dose does not need refills Continue Keppra at current dose refill Followup in 6 months

## 2013-10-10 NOTE — Progress Notes (Signed)
I have read the note, and I agree with the clinical assessment and plan.  Yuval Nolet K Ryland Smoots   

## 2014-01-24 ENCOUNTER — Other Ambulatory Visit: Payer: Self-pay | Admitting: Neurology

## 2014-04-11 ENCOUNTER — Encounter: Payer: Self-pay | Admitting: Nurse Practitioner

## 2014-04-11 ENCOUNTER — Ambulatory Visit (INDEPENDENT_AMBULATORY_CARE_PROVIDER_SITE_OTHER): Payer: Medicare Other | Admitting: Nurse Practitioner

## 2014-04-11 VITALS — BP 128/83 | HR 80 | Ht 70.0 in | Wt 206.0 lb

## 2014-04-11 DIAGNOSIS — G40209 Localization-related (focal) (partial) symptomatic epilepsy and epileptic syndromes with complex partial seizures, not intractable, without status epilepticus: Secondary | ICD-10-CM | POA: Diagnosis not present

## 2014-04-11 DIAGNOSIS — Z87820 Personal history of traumatic brain injury: Secondary | ICD-10-CM

## 2014-04-11 MED ORDER — LORAZEPAM 2 MG PO TABS: 2.0000 mg | ORAL_TABLET | Freq: Four times a day (QID) | ORAL | Status: DC | PRN

## 2014-04-11 MED ORDER — TOPIRAMATE 200 MG PO TABS: 200.0000 mg | ORAL_TABLET | Freq: Two times a day (BID) | ORAL | Status: DC

## 2014-04-11 MED ORDER — TOPIRAMATE 100 MG PO TABS: 100.0000 mg | ORAL_TABLET | Freq: Every day | ORAL | Status: DC

## 2014-04-11 MED ORDER — LEVETIRACETAM 1000 MG PO TABS
1000.0000 mg | ORAL_TABLET | Freq: Two times a day (BID) | ORAL | Status: DC
Start: 2014-04-11 — End: 2015-02-27

## 2014-04-11 NOTE — Patient Instructions (Signed)
Continue Topamax at current dose 500 mg daily Continue Keppra 1000 twice daily Rx for Ativan given Followup in 6-8 months
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
## 2014-04-11 NOTE — Progress Notes (Signed)
I have read the note, and I agree with the clinical assessment and plan.  [name],[name]   

## 2014-05-06 DIAGNOSIS — K580 Irritable bowel syndrome with diarrhea: Secondary | ICD-10-CM | POA: Diagnosis not present

## 2014-05-06 DIAGNOSIS — G40909 Epilepsy, unspecified, not intractable, without status epilepticus: Secondary | ICD-10-CM | POA: Diagnosis not present

## 2014-05-06 DIAGNOSIS — E786 Lipoprotein deficiency: Secondary | ICD-10-CM | POA: Diagnosis not present

## 2014-05-06 DIAGNOSIS — Z87898 Personal history of other specified conditions: Secondary | ICD-10-CM | POA: Diagnosis not present

## 2014-05-06 DIAGNOSIS — Z720 Tobacco use: Secondary | ICD-10-CM | POA: Diagnosis not present

## 2014-05-06 DIAGNOSIS — Z23 Encounter for immunization: Secondary | ICD-10-CM | POA: Diagnosis not present

## 2014-05-06 DIAGNOSIS — G8921 Chronic pain due to trauma: Secondary | ICD-10-CM | POA: Diagnosis not present

## 2014-05-06 DIAGNOSIS — H409 Unspecified glaucoma: Secondary | ICD-10-CM | POA: Diagnosis not present

## 2014-05-06 DIAGNOSIS — Z0001 Encounter for general adult medical examination with abnormal findings: Secondary | ICD-10-CM | POA: Diagnosis not present

## 2014-06-19 ENCOUNTER — Other Ambulatory Visit: Payer: Self-pay | Admitting: Neurology

## 2014-07-15 ENCOUNTER — Other Ambulatory Visit: Payer: Self-pay | Admitting: Neurology

## 2014-11-11 ENCOUNTER — Encounter: Payer: Self-pay | Admitting: Neurology

## 2014-11-11 ENCOUNTER — Ambulatory Visit (INDEPENDENT_AMBULATORY_CARE_PROVIDER_SITE_OTHER): Payer: Medicare Other | Admitting: Neurology

## 2014-11-11 VITALS — BP 114/74 | HR 82 | Ht 72.0 in | Wt 211.8 lb

## 2014-11-11 DIAGNOSIS — Z87820 Personal history of traumatic brain injury: Secondary | ICD-10-CM

## 2014-11-11 DIAGNOSIS — G40209 Localization-related (focal) (partial) symptomatic epilepsy and epileptic syndromes with complex partial seizures, not intractable, without status epilepticus: Secondary | ICD-10-CM

## 2014-11-11 MED ORDER — TOPIRAMATE 100 MG PO TABS: 100.0000 mg | ORAL_TABLET | Freq: Every day | ORAL | Status: DC
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
## 2014-11-11 NOTE — Patient Instructions (Signed)

## 2015-02-12 ENCOUNTER — Ambulatory Visit (INDEPENDENT_AMBULATORY_CARE_PROVIDER_SITE_OTHER): Payer: Medicare Other | Admitting: Neurology

## 2015-02-12 ENCOUNTER — Encounter: Payer: Self-pay | Admitting: Neurology

## 2015-02-12 VITALS — BP 95/67 | HR 68 | Ht 72.0 in | Wt 211.6 lb

## 2015-02-12 DIAGNOSIS — F5104 Psychophysiologic insomnia: Secondary | ICD-10-CM

## 2015-02-12 DIAGNOSIS — G4700 Insomnia, unspecified: Secondary | ICD-10-CM | POA: Diagnosis not present

## 2015-02-12 MED ORDER — LORAZEPAM 2 MG PO TABS: 2.0000 mg | ORAL_TABLET | Freq: Four times a day (QID) | ORAL | Status: DC | PRN

## 2015-02-12 MED ORDER — TRAZODONE HCL 50 MG PO TABS: 50.0000 mg | ORAL_TABLET | Freq: Every day | ORAL | Status: DC
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
## 2015-02-12 NOTE — Patient Instructions (Addendum)
We will try trazodone for sleep. Try 50 mg at night, but if this is not effective, go to 100 mg at night.    Epilepsy Epilepsy is a disorder in which a person has repeated seizures over time. A seizure is a release of abnormal electrical activity in the brain. Seizures can cause a change in attention, behavior, or the ability to remain awake and alert (altered mental status). Seizures often involve uncontrollable shaking (convulsions).  Most people with epilepsy lead normal lives. However, people with epilepsy are at an increased risk of falls, accidents, and injuries. Therefore, it is important to begin treatment right away. CAUSES  Epilepsy has many possible causes. Anything that disturbs the normal pattern of brain cell activity can lead to seizures. This may include:   Head injury.  Birth trauma.  High fever as a child.  Stroke.  Bleeding into or around the brain.  Certain drugs.  Prolonged low oxygen, such as what occurs after CPR efforts.  Abnormal brain development.  Certain illnesses, such as meningitis, encephalitis (brain infection), malaria, and other infections.  An imbalance of nerve signaling chemicals (neurotransmitters).  SIGNS AND SYMPTOMS  The symptoms of a seizure can vary greatly from one person to another. Right before a seizure, you may have a warning (aura) that a seizure is about to occur. An aura may include the following symptoms:  Fear or anxiety.  Nausea.  Feeling like the room is spinning (vertigo).  Vision changes, such as seeing flashing lights or spots. Common symptoms during a seizure include:  Abnormal sensations, such as an abnormal smell or a bitter taste in the mouth.   Sudden, general body stiffness.   Convulsions that involve rhythmic jerking of the face, arm, or leg on one or both sides.   Sudden change in consciousness.   Appearing to be awake but not responding.   Appearing to be asleep but cannot be awakened.    Grimacing, chewing, lip smacking, drooling, tongue biting, or loss of bowel or bladder control. After a seizure, you may feel sleepy for a while. DIAGNOSIS  Your health care provider will ask about your symptoms and take a medical history. Descriptions from any witnesses to your seizures will be very helpful in the diagnosis. A physical exam, including a detailed neurological exam, is necessary. Various tests may be done, such as:   An electroencephalogram (EEG). This is a painless test of your brain waves. In this test, a diagram is created of your brain waves. These diagrams can be interpreted by a specialist.  An MRI of the brain.   A CT scan of the brain.   A spinal tap (lumbar puncture, LP).  Blood tests to check for signs of infection or abnormal blood chemistry. TREATMENT  There is no cure for epilepsy, but it is generally treatable. Once epilepsy is diagnosed, it is important to begin treatment as soon as possible. For most people with epilepsy, seizures can be controlled with medicines. The following may also be used:  A pacemaker for the brain (vagus nerve stimulator) can be used for people with seizures that are not well controlled by medicine.  Surgery on the brain. For some people, epilepsy eventually goes away. HOME CARE INSTRUCTIONS   Follow your health care provider's recommendations on driving and safety in normal activities.  Get enough rest. Lack of sleep can cause seizures.  Only take over-the-counter or prescription medicines as directed by your health care provider. Take any prescribed medicine exactly as directed.  Avoid any known triggers of your seizures.  Keep a seizure diary. Record what you recall about any seizure, especially any possible trigger.   Make sure the people you live and work with know that you are prone to seizures. They should receive instructions on how to help you. In general, a witness to a seizure should:   Cushion your head  and body.   Turn you on your side.   Avoid unnecessarily restraining you.   Not place anything inside your mouth.   Call for emergency medical help if there is any question about what has occurred.   Follow up with your health care provider as directed. You may need regular blood tests to monitor the levels of your medicine.  SEEK MEDICAL CARE IF:   You develop signs of infection or other illness. This might increase the risk of a seizure.   You seem to be having more frequent seizures.   Your seizure pattern is changing.  SEEK IMMEDIATE MEDICAL CARE IF:   You have a seizure that does not stop after a few moments.   You have a seizure that causes any difficulty in breathing.   You have a seizure that results in a very severe headache.   You have a seizure that leaves you with the inability to speak or use a part of your body.  Document Released: 06/21/2005 Document Revised: 04/11/2013 Document Reviewed: 01/31/2013 [facility] Patient Information 2015 [employer], [place]. This information is not intended to replace advice given to you by your health care provider. Make sure you discuss any questions you have with your health care provider.

## 2015-02-27 ENCOUNTER — Other Ambulatory Visit: Payer: Self-pay | Admitting: Nurse Practitioner

## 2015-02-28 ENCOUNTER — Telehealth: Payer: Self-pay | Admitting: Neurology

## 2015-02-28 MED ORDER — TRAZODONE HCL 50 MG PO TABS: 100.0000 mg | ORAL_TABLET | Freq: Every day | ORAL | Status: DC

## 2015-02-28 NOTE — Telephone Encounter (Signed)
Patient's mother is calling. The patient was given a Rx for traZODone (DESYREL) 50 MG tablet and was told by Dr. Anne Hahn he could take up to 2 pills at night. The patient has been taking 2 at night.The patient called for a refill and was told by [facility] that the Rx could not be filled. Patient's mother states Dr. Anne Hahn told the patient they could change the medication to  if he takes 2 pills at night. The patient is completely out of medication. Thank you.

## 2015-02-28 NOTE — Telephone Encounter (Addendum)
Last OV note says: Try 50 mg at night, but if this is not effective, go to 100 mg at night.  Rx has been updated and sent.  Receipt confirmed by pharmacy.  I called back to advise.  They are aware.  Wanted a message sent to provider advising  nightly is working well.

## 2015-02-28 NOTE — Telephone Encounter (Signed)
Noted. I did not call the patient. 

## 2015-03-11 ENCOUNTER — Telehealth: Payer: Self-pay | Admitting: Nurse Practitioner

## 2015-03-11 MED ORDER — TOPIRAMATE 100 MG PO TABS: 100.0000 mg | ORAL_TABLET | Freq: Every day | ORAL | Status: DC

## 2015-03-11 MED ORDER — TOPIRAMATE 200 MG PO TABS: 200.0000 mg | ORAL_TABLET | Freq: Two times a day (BID) | ORAL | Status: DC

## 2015-03-11 NOTE — Telephone Encounter (Signed)
Patient takes a combination of Topamax  and Topamax  tablets.  Rx's have been sent.

## 2015-03-11 NOTE — Telephone Encounter (Signed)
[name] with [facility] is calling and stated that the patient's Rx topiramate 200 has been discontinued, therefore they need an new Rx for topiramate 100 tablets.  Please call.

## 2015-03-28 DIAGNOSIS — M2391 Unspecified internal derangement of right knee: Secondary | ICD-10-CM | POA: Diagnosis not present

## 2015-04-23 ENCOUNTER — Other Ambulatory Visit: Payer: Self-pay | Admitting: Neurology

## 2015-05-01 DIAGNOSIS — Z23 Encounter for immunization: Secondary | ICD-10-CM | POA: Diagnosis not present

## 2015-05-09 DIAGNOSIS — M2392 Unspecified internal derangement of left knee: Secondary | ICD-10-CM | POA: Diagnosis not present

## 2015-05-09 DIAGNOSIS — M87188 Osteonecrosis due to drugs, other site: Secondary | ICD-10-CM | POA: Diagnosis not present

## 2015-05-09 DIAGNOSIS — T380X5A Adverse effect of glucocorticoids and synthetic analogues, initial encounter: Secondary | ICD-10-CM | POA: Diagnosis not present

## 2015-05-09 DIAGNOSIS — M2391 Unspecified internal derangement of right knee: Secondary | ICD-10-CM | POA: Diagnosis not present

## 2015-05-20 DIAGNOSIS — H47093 Other disorders of optic nerve, not elsewhere classified, bilateral: Secondary | ICD-10-CM | POA: Diagnosis not present

## 2015-05-20 DIAGNOSIS — H52221 Regular astigmatism, right eye: Secondary | ICD-10-CM | POA: Diagnosis not present

## 2015-05-20 DIAGNOSIS — H40021 Open angle with borderline findings, high risk, right eye: Secondary | ICD-10-CM | POA: Diagnosis not present

## 2015-05-20 DIAGNOSIS — H547 Unspecified visual loss: Secondary | ICD-10-CM | POA: Diagnosis not present

## 2015-05-20 DIAGNOSIS — H3589 Other specified retinal disorders: Secondary | ICD-10-CM | POA: Diagnosis not present

## 2015-05-20 DIAGNOSIS — H4720 Unspecified optic atrophy: Secondary | ICD-10-CM | POA: Diagnosis not present

## 2015-05-20 DIAGNOSIS — H5201 Hypermetropia, right eye: Secondary | ICD-10-CM | POA: Diagnosis not present

## 2015-05-23 DIAGNOSIS — M25561 Pain in right knee: Secondary | ICD-10-CM | POA: Diagnosis not present

## 2015-05-23 DIAGNOSIS — M25562 Pain in left knee: Secondary | ICD-10-CM | POA: Diagnosis not present

## 2015-06-02 DIAGNOSIS — Z13220 Encounter for screening for lipoid disorders: Secondary | ICD-10-CM | POA: Diagnosis not present

## 2015-06-02 DIAGNOSIS — K580 Irritable bowel syndrome with diarrhea: Secondary | ICD-10-CM | POA: Diagnosis not present

## 2015-06-02 DIAGNOSIS — Z23 Encounter for immunization: Secondary | ICD-10-CM | POA: Diagnosis not present

## 2015-06-02 DIAGNOSIS — G40909 Epilepsy, unspecified, not intractable, without status epilepticus: Secondary | ICD-10-CM | POA: Diagnosis not present

## 2015-06-02 DIAGNOSIS — Z87898 Personal history of other specified conditions: Secondary | ICD-10-CM | POA: Diagnosis not present

## 2015-06-02 DIAGNOSIS — Z720 Tobacco use: Secondary | ICD-10-CM | POA: Diagnosis not present

## 2015-06-02 DIAGNOSIS — M810 Age-related osteoporosis without current pathological fracture: Secondary | ICD-10-CM | POA: Diagnosis not present

## 2015-06-02 DIAGNOSIS — Z0001 Encounter for general adult medical examination with abnormal findings: Secondary | ICD-10-CM | POA: Diagnosis not present

## 2015-06-02 DIAGNOSIS — H409 Unspecified glaucoma: Secondary | ICD-10-CM | POA: Diagnosis not present

## 2015-06-02 DIAGNOSIS — Z79899 Other long term (current) drug therapy: Secondary | ICD-10-CM | POA: Diagnosis not present

## 2015-06-02 DIAGNOSIS — G8921 Chronic pain due to trauma: Secondary | ICD-10-CM | POA: Diagnosis not present

## 2015-06-04 DIAGNOSIS — M25561 Pain in right knee: Secondary | ICD-10-CM | POA: Diagnosis not present

## 2015-06-04 DIAGNOSIS — M2392 Unspecified internal derangement of left knee: Secondary | ICD-10-CM | POA: Diagnosis not present

## 2015-06-12 DIAGNOSIS — H40021 Open angle with borderline findings, high risk, right eye: Secondary | ICD-10-CM | POA: Diagnosis not present

## 2015-06-17 DIAGNOSIS — M25561 Pain in right knee: Secondary | ICD-10-CM | POA: Diagnosis not present

## 2015-07-08 ENCOUNTER — Encounter: Payer: Self-pay | Admitting: Neurology

## 2015-07-10 DIAGNOSIS — M25561 Pain in right knee: Secondary | ICD-10-CM | POA: Diagnosis not present

## 2015-08-05 DIAGNOSIS — H401121 Primary open-angle glaucoma, left eye, mild stage: Secondary | ICD-10-CM | POA: Diagnosis not present

## 2015-08-05 DIAGNOSIS — S0502XA Injury of conjunctiva and corneal abrasion without foreign body, left eye, initial encounter: Secondary | ICD-10-CM | POA: Diagnosis not present

## 2015-08-05 DIAGNOSIS — H04123 Dry eye syndrome of bilateral lacrimal glands: Secondary | ICD-10-CM | POA: Diagnosis not present

## 2015-08-18 ENCOUNTER — Encounter: Payer: Self-pay | Admitting: Nurse Practitioner

## 2015-08-18 ENCOUNTER — Ambulatory Visit (INDEPENDENT_AMBULATORY_CARE_PROVIDER_SITE_OTHER): Payer: Medicare Other | Admitting: Nurse Practitioner

## 2015-08-18 VITALS — BP 120/70 | HR 70 | Wt 198.4 lb

## 2015-08-18 DIAGNOSIS — G40209 Localization-related (focal) (partial) symptomatic epilepsy and epileptic syndromes with complex partial seizures, not intractable, without status epilepticus: Secondary | ICD-10-CM | POA: Diagnosis not present

## 2015-08-18 DIAGNOSIS — G4700 Insomnia, unspecified: Secondary | ICD-10-CM

## 2015-08-18 DIAGNOSIS — Z87820 Personal history of traumatic brain injury: Secondary | ICD-10-CM

## 2015-08-18 DIAGNOSIS — F5104 Psychophysiologic insomnia: Secondary | ICD-10-CM

## 2015-08-18 MED ORDER — TOPIRAMATE 100 MG PO TABS
100.0000 mg | ORAL_TABLET | Freq: Every day | ORAL | Status: DC
Start: 2015-08-18 — End: 2016-04-15

## 2015-08-18 MED ORDER — TOPIRAMATE 200 MG PO TABS: 200.0000 mg | ORAL_TABLET | Freq: Two times a day (BID) | ORAL | Status: DC

## 2015-08-18 NOTE — Progress Notes (Signed)
I have read the note, and I agree with the clinical assessment and plan.  [name]   
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
## 2015-08-18 NOTE — Patient Instructions (Addendum)
Continue Keppra for seizure disorder will refill Continue Topamax for seizure disorder and headache Continue trazodone as needed for sleep Follow-up in 8 months Call for any seizure activity

## 2015-08-25 DIAGNOSIS — Z471 Aftercare following joint replacement surgery: Secondary | ICD-10-CM | POA: Diagnosis not present

## 2015-08-25 DIAGNOSIS — Z96641 Presence of right artificial hip joint: Secondary | ICD-10-CM | POA: Diagnosis not present

## 2015-08-25 DIAGNOSIS — M25561 Pain in right knee: Secondary | ICD-10-CM | POA: Diagnosis not present

## 2015-08-25 DIAGNOSIS — M25551 Pain in right hip: Secondary | ICD-10-CM | POA: Diagnosis not present

## 2015-09-21 ENCOUNTER — Other Ambulatory Visit: Payer: Self-pay | Admitting: Neurology

## 2015-09-23 DIAGNOSIS — M8589 Other specified disorders of bone density and structure, multiple sites: Secondary | ICD-10-CM | POA: Diagnosis not present

## 2015-12-15 DIAGNOSIS — H47093 Other disorders of optic nerve, not elsewhere classified, bilateral: Secondary | ICD-10-CM | POA: Diagnosis not present

## 2015-12-15 DIAGNOSIS — H04123 Dry eye syndrome of bilateral lacrimal glands: Secondary | ICD-10-CM | POA: Diagnosis not present

## 2015-12-15 DIAGNOSIS — H401214 Low-tension glaucoma, right eye, indeterminate stage: Secondary | ICD-10-CM | POA: Diagnosis not present

## 2016-01-11 DIAGNOSIS — H6691 Otitis media, unspecified, right ear: Secondary | ICD-10-CM | POA: Diagnosis not present

## 2016-01-11 DIAGNOSIS — H6091 Unspecified otitis externa, right ear: Secondary | ICD-10-CM | POA: Diagnosis not present

## 2016-04-07 DIAGNOSIS — M87051 Idiopathic aseptic necrosis of right femur: Secondary | ICD-10-CM | POA: Diagnosis not present

## 2016-04-07 DIAGNOSIS — M25561 Pain in right knee: Secondary | ICD-10-CM | POA: Diagnosis not present

## 2016-04-14 DIAGNOSIS — H47231 Glaucomatous optic atrophy, right eye: Secondary | ICD-10-CM | POA: Diagnosis not present

## 2016-04-14 DIAGNOSIS — H40023 Open angle with borderline findings, high risk, bilateral: Secondary | ICD-10-CM | POA: Diagnosis not present

## 2016-04-14 DIAGNOSIS — H11423 Conjunctival edema, bilateral: Secondary | ICD-10-CM | POA: Diagnosis not present

## 2016-04-14 DIAGNOSIS — H3589 Other specified retinal disorders: Secondary | ICD-10-CM | POA: Diagnosis not present

## 2016-04-14 DIAGNOSIS — H4720 Unspecified optic atrophy: Secondary | ICD-10-CM | POA: Diagnosis not present

## 2016-04-14 DIAGNOSIS — H25013 Cortical age-related cataract, bilateral: Secondary | ICD-10-CM | POA: Diagnosis not present

## 2016-04-14 DIAGNOSIS — H18413 Arcus senilis, bilateral: Secondary | ICD-10-CM | POA: Diagnosis not present

## 2016-04-14 DIAGNOSIS — H11153 Pinguecula, bilateral: Secondary | ICD-10-CM | POA: Diagnosis not present

## 2016-04-14 DIAGNOSIS — H469 Unspecified optic neuritis: Secondary | ICD-10-CM | POA: Diagnosis not present

## 2016-04-14 DIAGNOSIS — H40003 Preglaucoma, unspecified, bilateral: Secondary | ICD-10-CM | POA: Diagnosis not present

## 2016-04-15 ENCOUNTER — Ambulatory Visit (INDEPENDENT_AMBULATORY_CARE_PROVIDER_SITE_OTHER): Payer: Medicare Other | Admitting: Nurse Practitioner

## 2016-04-15 ENCOUNTER — Encounter: Payer: Self-pay | Admitting: Nurse Practitioner

## 2016-04-15 VITALS — BP 99/76 | HR 75 | Ht 72.0 in | Wt 211.2 lb

## 2016-04-15 DIAGNOSIS — G40209 Localization-related (focal) (partial) symptomatic epilepsy and epileptic syndromes with complex partial seizures, not intractable, without status epilepticus: Secondary | ICD-10-CM

## 2016-04-15 DIAGNOSIS — Z87820 Personal history of traumatic brain injury: Secondary | ICD-10-CM

## 2016-04-15 DIAGNOSIS — F5104 Psychophysiologic insomnia: Secondary | ICD-10-CM

## 2016-04-15 MED ORDER — TOPIRAMATE 100 MG PO TABS: 100.0000 mg | ORAL_TABLET | Freq: Every day | ORAL | 11 refills | Status: DC

## 2016-04-15 MED ORDER — LEVETIRACETAM 1000 MG PO TABS: ORAL_TABLET | ORAL | 11 refills | Status: DC

## 2016-04-15 MED ORDER — TOPIRAMATE 200 MG PO TABS: 200.0000 mg | ORAL_TABLET | Freq: Two times a day (BID) | ORAL | 11 refills | Status: DC

## 2016-04-15 NOTE — Progress Notes (Signed)
I have read the note, and I agree with the clinical assessment and plan.  WILLIS,CHARLES KEITH   

## 2016-04-15 NOTE — Patient Instructions (Signed)
Continue Keppra for seizure disorder will refill Continue Topamax for seizure disorder and headache Continue trazodone as needed for sleep Follow-up in1 year next with Dr. Anne HahnWillis Call for any seizure activity
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
## 2016-04-28 DIAGNOSIS — Z23 Encounter for immunization: Secondary | ICD-10-CM | POA: Diagnosis not present

## 2016-05-06 ENCOUNTER — Other Ambulatory Visit: Payer: Self-pay | Admitting: Neurology

## 2016-05-06 NOTE — Telephone Encounter (Signed)
Rx printed, signed, faxed to pharmacy. 

## 2016-07-28 DIAGNOSIS — Z0001 Encounter for general adult medical examination with abnormal findings: Secondary | ICD-10-CM | POA: Diagnosis not present

## 2016-07-28 DIAGNOSIS — E786 Lipoprotein deficiency: Secondary | ICD-10-CM | POA: Diagnosis not present

## 2016-07-28 DIAGNOSIS — G8921 Chronic pain due to trauma: Secondary | ICD-10-CM | POA: Diagnosis not present

## 2016-07-28 DIAGNOSIS — Z720 Tobacco use: Secondary | ICD-10-CM | POA: Diagnosis not present

## 2016-07-28 DIAGNOSIS — H409 Unspecified glaucoma: Secondary | ICD-10-CM | POA: Diagnosis not present

## 2016-07-28 DIAGNOSIS — Z79899 Other long term (current) drug therapy: Secondary | ICD-10-CM | POA: Diagnosis not present

## 2016-07-28 DIAGNOSIS — K580 Irritable bowel syndrome with diarrhea: Secondary | ICD-10-CM | POA: Diagnosis not present

## 2016-07-28 DIAGNOSIS — G40909 Epilepsy, unspecified, not intractable, without status epilepticus: Secondary | ICD-10-CM | POA: Diagnosis not present

## 2016-08-11 DIAGNOSIS — M25561 Pain in right knee: Secondary | ICD-10-CM | POA: Diagnosis not present

## 2016-08-11 DIAGNOSIS — M238X1 Other internal derangements of right knee: Secondary | ICD-10-CM | POA: Diagnosis not present

## 2016-08-11 DIAGNOSIS — M87051 Idiopathic aseptic necrosis of right femur: Secondary | ICD-10-CM | POA: Diagnosis not present

## 2016-08-23 ENCOUNTER — Encounter: Payer: Self-pay | Admitting: Neurology

## 2016-08-23 ENCOUNTER — Telehealth: Payer: Self-pay | Admitting: Neurology

## 2016-08-23 NOTE — Telephone Encounter (Signed)
Dr Willis- please advise 
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
## 2016-08-23 NOTE — Telephone Encounter (Signed)
I will write a letter.

## 2016-08-24 NOTE — Telephone Encounter (Signed)
Called and spoke to mother. Advised letter ready to be pick up at front desk. She verbalized understanding.

## 2016-10-14 DIAGNOSIS — H18413 Arcus senilis, bilateral: Secondary | ICD-10-CM | POA: Diagnosis not present

## 2016-10-14 DIAGNOSIS — H52221 Regular astigmatism, right eye: Secondary | ICD-10-CM | POA: Diagnosis not present

## 2016-10-14 DIAGNOSIS — H4711 Papilledema associated with increased intracranial pressure: Secondary | ICD-10-CM | POA: Diagnosis not present

## 2016-10-14 DIAGNOSIS — H4720 Unspecified optic atrophy: Secondary | ICD-10-CM | POA: Diagnosis not present

## 2016-10-14 DIAGNOSIS — H40023 Open angle with borderline findings, high risk, bilateral: Secondary | ICD-10-CM | POA: Diagnosis not present

## 2016-10-14 DIAGNOSIS — H5201 Hypermetropia, right eye: Secondary | ICD-10-CM | POA: Diagnosis not present

## 2016-10-14 DIAGNOSIS — H25013 Cortical age-related cataract, bilateral: Secondary | ICD-10-CM | POA: Diagnosis not present

## 2016-10-14 DIAGNOSIS — H3589 Other specified retinal disorders: Secondary | ICD-10-CM | POA: Diagnosis not present

## 2016-10-14 DIAGNOSIS — H2513 Age-related nuclear cataract, bilateral: Secondary | ICD-10-CM | POA: Diagnosis not present

## 2016-10-14 DIAGNOSIS — H40003 Preglaucoma, unspecified, bilateral: Secondary | ICD-10-CM | POA: Diagnosis not present

## 2016-10-14 DIAGNOSIS — H5340 Unspecified visual field defects: Secondary | ICD-10-CM | POA: Diagnosis not present

## 2016-11-16 ENCOUNTER — Telehealth: Payer: Self-pay | Admitting: Nurse Practitioner
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
## 2016-11-24 DIAGNOSIS — M1711 Unilateral primary osteoarthritis, right knee: Secondary | ICD-10-CM | POA: Diagnosis not present

## 2017-01-18 DIAGNOSIS — E782 Mixed hyperlipidemia: Secondary | ICD-10-CM | POA: Diagnosis not present

## 2017-02-06 ENCOUNTER — Other Ambulatory Visit: Payer: Self-pay | Admitting: Nurse Practitioner

## 2017-02-18 DIAGNOSIS — M1711 Unilateral primary osteoarthritis, right knee: Secondary | ICD-10-CM | POA: Diagnosis not present

## 2017-02-18 DIAGNOSIS — M25561 Pain in right knee: Secondary | ICD-10-CM | POA: Diagnosis not present

## 2017-02-18 DIAGNOSIS — M21372 Foot drop, left foot: Secondary | ICD-10-CM | POA: Diagnosis not present

## 2017-03-09 ENCOUNTER — Other Ambulatory Visit: Payer: Self-pay | Admitting: Nurse Practitioner

## 2017-04-19 ENCOUNTER — Encounter: Payer: Self-pay | Admitting: Neurology

## 2017-04-19 ENCOUNTER — Ambulatory Visit (INDEPENDENT_AMBULATORY_CARE_PROVIDER_SITE_OTHER): Payer: Medicare Other | Admitting: Neurology

## 2017-04-19 VITALS — BP 102/66 | HR 76 | Ht 72.0 in | Wt 207.5 lb

## 2017-04-19 DIAGNOSIS — E782 Mixed hyperlipidemia: Secondary | ICD-10-CM | POA: Diagnosis not present

## 2017-04-19 DIAGNOSIS — G40209 Localization-related (focal) (partial) symptomatic epilepsy and epileptic syndromes with complex partial seizures, not intractable, without status epilepticus: Secondary | ICD-10-CM | POA: Diagnosis not present

## 2017-04-19 MED ORDER — LORAZEPAM 2 MG PO TABS: 2.0000 mg | ORAL_TABLET | Freq: Four times a day (QID) | ORAL | 3 refills | Status: DC | PRN

## 2017-04-19 MED ORDER — LEVETIRACETAM 1000 MG PO TABS: ORAL_TABLET | ORAL | 3 refills | Status: DC
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
## 2017-04-26 DIAGNOSIS — Z23 Encounter for immunization: Secondary | ICD-10-CM | POA: Diagnosis not present

## 2017-05-11 ENCOUNTER — Other Ambulatory Visit: Payer: Self-pay | Admitting: *Deleted

## 2017-05-11 ENCOUNTER — Telehealth: Payer: Self-pay | Admitting: *Deleted

## 2017-05-11 MED ORDER — TOPIRAMATE 100 MG PO TABS: 100.0000 mg | ORAL_TABLET | Freq: Every day | ORAL | 3 refills | Status: DC

## 2017-05-11 NOTE — Telephone Encounter (Signed)
[facility] [place] [place] [facility] refill on topamax 100mg  1 tablet daily.   Called pt. Verified he takes topamax 100mg  daily along with topamax 200mg  twice daily.  Advised we will send 90 day supply 100mg  tablet to [facility] as requested. Verbalized understanding and appreciation for call.

## 2017-08-09 DIAGNOSIS — H524 Presbyopia: Secondary | ICD-10-CM | POA: Diagnosis not present

## 2017-08-09 DIAGNOSIS — H5201 Hypermetropia, right eye: Secondary | ICD-10-CM | POA: Diagnosis not present

## 2017-08-09 DIAGNOSIS — H52221 Regular astigmatism, right eye: Secondary | ICD-10-CM | POA: Diagnosis not present

## 2017-08-09 DIAGNOSIS — H35371 Puckering of macula, right eye: Secondary | ICD-10-CM | POA: Diagnosis not present

## 2017-08-09 DIAGNOSIS — H47093 Other disorders of optic nerve, not elsewhere classified, bilateral: Secondary | ICD-10-CM | POA: Diagnosis not present

## 2017-08-09 DIAGNOSIS — H25013 Cortical age-related cataract, bilateral: Secondary | ICD-10-CM | POA: Diagnosis not present

## 2017-08-09 DIAGNOSIS — H4720 Unspecified optic atrophy: Secondary | ICD-10-CM | POA: Diagnosis not present

## 2017-08-10 DIAGNOSIS — K580 Irritable bowel syndrome with diarrhea: Secondary | ICD-10-CM | POA: Diagnosis not present

## 2017-08-10 DIAGNOSIS — M810 Age-related osteoporosis without current pathological fracture: Secondary | ICD-10-CM | POA: Diagnosis not present

## 2017-08-10 DIAGNOSIS — E782 Mixed hyperlipidemia: Secondary | ICD-10-CM | POA: Diagnosis not present

## 2017-08-10 DIAGNOSIS — Z720 Tobacco use: Secondary | ICD-10-CM | POA: Diagnosis not present

## 2017-08-10 DIAGNOSIS — Z0000 Encounter for general adult medical examination without abnormal findings: Secondary | ICD-10-CM | POA: Diagnosis not present

## 2017-08-10 DIAGNOSIS — H409 Unspecified glaucoma: Secondary | ICD-10-CM | POA: Diagnosis not present

## 2017-08-10 DIAGNOSIS — G8921 Chronic pain due to trauma: Secondary | ICD-10-CM | POA: Diagnosis not present

## 2017-08-10 DIAGNOSIS — G40909 Epilepsy, unspecified, not intractable, without status epilepticus: Secondary | ICD-10-CM | POA: Diagnosis not present

## 2017-09-20 DIAGNOSIS — M25562 Pain in left knee: Secondary | ICD-10-CM | POA: Diagnosis not present

## 2017-09-20 DIAGNOSIS — G8929 Other chronic pain: Secondary | ICD-10-CM | POA: Diagnosis not present

## 2017-09-20 DIAGNOSIS — M8700 Idiopathic aseptic necrosis of unspecified bone: Secondary | ICD-10-CM | POA: Diagnosis not present

## 2017-09-20 DIAGNOSIS — M25561 Pain in right knee: Secondary | ICD-10-CM | POA: Diagnosis not present

## 2017-10-04 DIAGNOSIS — M170 Bilateral primary osteoarthritis of knee: Secondary | ICD-10-CM | POA: Diagnosis not present

## 2017-10-11 DIAGNOSIS — M170 Bilateral primary osteoarthritis of knee: Secondary | ICD-10-CM | POA: Diagnosis not present

## 2017-10-18 DIAGNOSIS — M170 Bilateral primary osteoarthritis of knee: Secondary | ICD-10-CM | POA: Diagnosis not present

## 2018-02-09 DIAGNOSIS — H0100A Unspecified blepharitis right eye, upper and lower eyelids: Secondary | ICD-10-CM | POA: Diagnosis not present

## 2018-02-09 DIAGNOSIS — H401134 Primary open-angle glaucoma, bilateral, indeterminate stage: Secondary | ICD-10-CM | POA: Diagnosis not present

## 2018-02-09 DIAGNOSIS — H4720 Unspecified optic atrophy: Secondary | ICD-10-CM | POA: Diagnosis not present

## 2018-02-09 DIAGNOSIS — H0100B Unspecified blepharitis left eye, upper and lower eyelids: Secondary | ICD-10-CM | POA: Diagnosis not present

## 2018-02-09 DIAGNOSIS — H25013 Cortical age-related cataract, bilateral: Secondary | ICD-10-CM | POA: Diagnosis not present

## 2018-02-09 DIAGNOSIS — H401124 Primary open-angle glaucoma, left eye, indeterminate stage: Secondary | ICD-10-CM | POA: Diagnosis not present

## 2018-02-15 DIAGNOSIS — M25561 Pain in right knee: Secondary | ICD-10-CM | POA: Diagnosis not present

## 2018-02-15 DIAGNOSIS — G8929 Other chronic pain: Secondary | ICD-10-CM | POA: Diagnosis not present

## 2018-02-15 DIAGNOSIS — M87059 Idiopathic aseptic necrosis of unspecified femur: Secondary | ICD-10-CM | POA: Diagnosis not present

## 2018-02-28 DIAGNOSIS — M87051 Idiopathic aseptic necrosis of right femur: Secondary | ICD-10-CM | POA: Diagnosis not present

## 2018-03-03 DIAGNOSIS — M25561 Pain in right knee: Secondary | ICD-10-CM | POA: Diagnosis not present

## 2018-03-03 DIAGNOSIS — G8929 Other chronic pain: Secondary | ICD-10-CM | POA: Diagnosis not present

## 2018-03-03 DIAGNOSIS — M87059 Idiopathic aseptic necrosis of unspecified femur: Secondary | ICD-10-CM | POA: Diagnosis not present

## 2018-03-12 ENCOUNTER — Other Ambulatory Visit: Payer: Self-pay | Admitting: Nurse Practitioner

## 2018-04-07 ENCOUNTER — Other Ambulatory Visit: Payer: Self-pay | Admitting: Neurology

## 2018-04-10 DIAGNOSIS — H401134 Primary open-angle glaucoma, bilateral, indeterminate stage: Secondary | ICD-10-CM | POA: Diagnosis not present

## 2018-04-10 DIAGNOSIS — H04123 Dry eye syndrome of bilateral lacrimal glands: Secondary | ICD-10-CM | POA: Diagnosis not present

## 2018-04-10 DIAGNOSIS — H11153 Pinguecula, bilateral: Secondary | ICD-10-CM | POA: Diagnosis not present

## 2018-04-10 DIAGNOSIS — H0100A Unspecified blepharitis right eye, upper and lower eyelids: Secondary | ICD-10-CM | POA: Diagnosis not present

## 2018-04-10 DIAGNOSIS — H0100B Unspecified blepharitis left eye, upper and lower eyelids: Secondary | ICD-10-CM | POA: Diagnosis not present

## 2018-04-10 DIAGNOSIS — H47233 Glaucomatous optic atrophy, bilateral: Secondary | ICD-10-CM | POA: Diagnosis not present

## 2018-04-10 DIAGNOSIS — H25013 Cortical age-related cataract, bilateral: Secondary | ICD-10-CM | POA: Diagnosis not present

## 2018-04-10 DIAGNOSIS — H18413 Arcus senilis, bilateral: Secondary | ICD-10-CM | POA: Diagnosis not present

## 2018-04-10 DIAGNOSIS — H4720 Unspecified optic atrophy: Secondary | ICD-10-CM | POA: Diagnosis not present

## 2018-04-19 ENCOUNTER — Ambulatory Visit (INDEPENDENT_AMBULATORY_CARE_PROVIDER_SITE_OTHER): Payer: Medicare Other | Admitting: Adult Health

## 2018-04-19 ENCOUNTER — Encounter

## 2018-04-19 ENCOUNTER — Encounter: Payer: Self-pay | Admitting: Adult Health

## 2018-04-19 VITALS — BP 123/74 | HR 84 | Ht 71.0 in | Wt 230.0 lb

## 2018-04-19 DIAGNOSIS — G40209 Localization-related (focal) (partial) symptomatic epilepsy and epileptic syndromes with complex partial seizures, not intractable, without status epilepticus: Secondary | ICD-10-CM

## 2018-04-19 MED ORDER — TOPIRAMATE 100 MG PO TABS: 100.0000 mg | ORAL_TABLET | Freq: Every day | ORAL | 3 refills | Status: DC
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
## 2018-04-19 NOTE — Progress Notes (Signed)
I have read the note, and I agree with the clinical assessment and plan.  [name]   

## 2018-04-19 NOTE — Patient Instructions (Signed)
Your Plan:  Continue Keppra and Topamax If your symptoms worsen or you develop new symptoms please let us know.   Thank you for coming to see [place] at [facility]. I hope we have been able to provide you high quality care today.  You may receive a patient satisfaction survey over the next few weeks. We would appreciate your feedback and comments so that we may continue to improve ourselves and the health of our patients.

## 2018-04-25 DIAGNOSIS — Z23 Encounter for immunization: Secondary | ICD-10-CM | POA: Diagnosis not present

## 2018-05-02 DIAGNOSIS — M170 Bilateral primary osteoarthritis of knee: Secondary | ICD-10-CM | POA: Diagnosis not present

## 2018-05-02 DIAGNOSIS — M25561 Pain in right knee: Secondary | ICD-10-CM | POA: Diagnosis not present

## 2018-05-09 DIAGNOSIS — M170 Bilateral primary osteoarthritis of knee: Secondary | ICD-10-CM | POA: Diagnosis not present

## 2018-05-16 DIAGNOSIS — M170 Bilateral primary osteoarthritis of knee: Secondary | ICD-10-CM | POA: Diagnosis not present

## 2018-08-02 DIAGNOSIS — H401130 Primary open-angle glaucoma, bilateral, stage unspecified: Secondary | ICD-10-CM | POA: Diagnosis not present

## 2018-12-06 DIAGNOSIS — Z720 Tobacco use: Secondary | ICD-10-CM | POA: Diagnosis not present

## 2018-12-06 DIAGNOSIS — E782 Mixed hyperlipidemia: Secondary | ICD-10-CM | POA: Diagnosis not present

## 2018-12-06 DIAGNOSIS — G8921 Chronic pain due to trauma: Secondary | ICD-10-CM | POA: Diagnosis not present

## 2018-12-06 DIAGNOSIS — H409 Unspecified glaucoma: Secondary | ICD-10-CM | POA: Diagnosis not present

## 2018-12-06 DIAGNOSIS — M810 Age-related osteoporosis without current pathological fracture: Secondary | ICD-10-CM | POA: Diagnosis not present

## 2018-12-06 DIAGNOSIS — Z7189 Other specified counseling: Secondary | ICD-10-CM | POA: Diagnosis not present

## 2018-12-06 DIAGNOSIS — G40909 Epilepsy, unspecified, not intractable, without status epilepticus: Secondary | ICD-10-CM | POA: Diagnosis not present

## 2018-12-06 DIAGNOSIS — K580 Irritable bowel syndrome with diarrhea: Secondary | ICD-10-CM | POA: Diagnosis not present

## 2018-12-06 DIAGNOSIS — Z0000 Encounter for general adult medical examination without abnormal findings: Secondary | ICD-10-CM | POA: Diagnosis not present

## 2019-01-23 DIAGNOSIS — M25561 Pain in right knee: Secondary | ICD-10-CM | POA: Diagnosis not present

## 2019-01-23 DIAGNOSIS — M87051 Idiopathic aseptic necrosis of right femur: Secondary | ICD-10-CM | POA: Diagnosis not present

## 2019-01-23 DIAGNOSIS — G8929 Other chronic pain: Secondary | ICD-10-CM | POA: Diagnosis not present

## 2019-02-14 DIAGNOSIS — M1711 Unilateral primary osteoarthritis, right knee: Secondary | ICD-10-CM | POA: Diagnosis not present

## 2019-02-19 DIAGNOSIS — H0102B Squamous blepharitis left eye, upper and lower eyelids: Secondary | ICD-10-CM | POA: Diagnosis not present

## 2019-02-19 DIAGNOSIS — H0102A Squamous blepharitis right eye, upper and lower eyelids: Secondary | ICD-10-CM | POA: Diagnosis not present

## 2019-02-19 DIAGNOSIS — H25013 Cortical age-related cataract, bilateral: Secondary | ICD-10-CM | POA: Diagnosis not present

## 2019-02-19 DIAGNOSIS — H401130 Primary open-angle glaucoma, bilateral, stage unspecified: Secondary | ICD-10-CM | POA: Diagnosis not present

## 2019-02-19 DIAGNOSIS — H18413 Arcus senilis, bilateral: Secondary | ICD-10-CM | POA: Diagnosis not present

## 2019-02-19 DIAGNOSIS — H47293 Other optic atrophy, bilateral: Secondary | ICD-10-CM | POA: Diagnosis not present

## 2019-02-19 DIAGNOSIS — H47233 Glaucomatous optic atrophy, bilateral: Secondary | ICD-10-CM | POA: Diagnosis not present

## 2019-02-19 DIAGNOSIS — H04123 Dry eye syndrome of bilateral lacrimal glands: Secondary | ICD-10-CM | POA: Diagnosis not present

## 2019-02-19 DIAGNOSIS — H11153 Pinguecula, bilateral: Secondary | ICD-10-CM | POA: Diagnosis not present

## 2019-02-21 DIAGNOSIS — G8929 Other chronic pain: Secondary | ICD-10-CM | POA: Diagnosis not present

## 2019-02-21 DIAGNOSIS — M25561 Pain in right knee: Secondary | ICD-10-CM | POA: Diagnosis not present

## 2019-02-21 DIAGNOSIS — M1711 Unilateral primary osteoarthritis, right knee: Secondary | ICD-10-CM | POA: Diagnosis not present

## 2019-02-28 DIAGNOSIS — M1711 Unilateral primary osteoarthritis, right knee: Secondary | ICD-10-CM | POA: Diagnosis not present

## 2019-03-22 ENCOUNTER — Other Ambulatory Visit: Payer: Self-pay | Admitting: Neurology

## 2019-03-26 ENCOUNTER — Other Ambulatory Visit: Payer: Self-pay | Admitting: Neurology

## 2019-04-01 ENCOUNTER — Other Ambulatory Visit: Payer: Self-pay | Admitting: Adult Health

## 2019-04-04 DIAGNOSIS — G8929 Other chronic pain: Secondary | ICD-10-CM | POA: Diagnosis not present

## 2019-04-04 DIAGNOSIS — M1711 Unilateral primary osteoarthritis, right knee: Secondary | ICD-10-CM | POA: Diagnosis not present

## 2019-04-04 DIAGNOSIS — M25561 Pain in right knee: Secondary | ICD-10-CM | POA: Diagnosis not present

## 2019-04-04 DIAGNOSIS — M87059 Idiopathic aseptic necrosis of unspecified femur: Secondary | ICD-10-CM | POA: Diagnosis not present

## 2019-04-12 DIAGNOSIS — M67961 Unspecified disorder of synovium and tendon, right lower leg: Secondary | ICD-10-CM | POA: Diagnosis not present

## 2019-04-12 DIAGNOSIS — M25461 Effusion, right knee: Secondary | ICD-10-CM | POA: Diagnosis not present

## 2019-04-12 DIAGNOSIS — M94261 Chondromalacia, right knee: Secondary | ICD-10-CM | POA: Diagnosis not present

## 2019-04-13 DIAGNOSIS — G8929 Other chronic pain: Secondary | ICD-10-CM | POA: Diagnosis not present

## 2019-04-13 DIAGNOSIS — M8788 Other osteonecrosis, other site: Secondary | ICD-10-CM | POA: Diagnosis not present

## 2019-04-13 DIAGNOSIS — M25561 Pain in right knee: Secondary | ICD-10-CM | POA: Diagnosis not present

## 2019-05-01 DIAGNOSIS — G9059 Complex regional pain syndrome I of other specified site: Secondary | ICD-10-CM | POA: Diagnosis not present

## 2019-05-01 DIAGNOSIS — Z79899 Other long term (current) drug therapy: Secondary | ICD-10-CM | POA: Diagnosis not present

## 2019-05-01 DIAGNOSIS — Z79891 Long term (current) use of opiate analgesic: Secondary | ICD-10-CM | POA: Diagnosis not present

## 2019-05-01 DIAGNOSIS — G894 Chronic pain syndrome: Secondary | ICD-10-CM | POA: Diagnosis not present

## 2019-05-01 DIAGNOSIS — M25561 Pain in right knee: Secondary | ICD-10-CM | POA: Diagnosis not present

## 2019-05-03 ENCOUNTER — Other Ambulatory Visit: Payer: Self-pay

## 2019-05-03 ENCOUNTER — Encounter: Payer: Self-pay | Admitting: Adult Health

## 2019-05-03 ENCOUNTER — Ambulatory Visit (INDEPENDENT_AMBULATORY_CARE_PROVIDER_SITE_OTHER): Payer: Medicare Other | Admitting: Adult Health

## 2019-05-03 VITALS — BP 114/79 | HR 105 | Ht 71.0 in | Wt 222.0 lb

## 2019-05-03 DIAGNOSIS — G40209 Localization-related (focal) (partial) symptomatic epilepsy and epileptic syndromes with complex partial seizures, not intractable, without status epilepticus: Secondary | ICD-10-CM

## 2019-05-03 MED ORDER — TOPIRAMATE 200 MG PO TABS: 200.0000 mg | ORAL_TABLET | Freq: Two times a day (BID) | ORAL | 3 refills | Status: DC

## 2019-05-03 MED ORDER — TOPIRAMATE 100 MG PO TABS: 100.0000 mg | ORAL_TABLET | Freq: Every day | ORAL | 3 refills | Status: DC

## 2019-05-03 NOTE — Patient Instructions (Signed)
Continue Keppra and Topamax If you have any seizure events please let us know.

## 2019-05-03 NOTE — Progress Notes (Signed)
I have read the note, and I agree with the clinical assessment and plan.  [name]   
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
## 2019-05-04 DIAGNOSIS — E782 Mixed hyperlipidemia: Secondary | ICD-10-CM | POA: Diagnosis not present

## 2019-05-04 DIAGNOSIS — K580 Irritable bowel syndrome with diarrhea: Secondary | ICD-10-CM | POA: Diagnosis not present

## 2019-05-04 DIAGNOSIS — Z23 Encounter for immunization: Secondary | ICD-10-CM | POA: Diagnosis not present

## 2019-05-17 DIAGNOSIS — M1711 Unilateral primary osteoarthritis, right knee: Secondary | ICD-10-CM | POA: Diagnosis not present

## 2019-06-18 DIAGNOSIS — G5771 Causalgia of right lower limb: Secondary | ICD-10-CM | POA: Diagnosis not present

## 2019-06-18 DIAGNOSIS — G894 Chronic pain syndrome: Secondary | ICD-10-CM | POA: Diagnosis not present

## 2019-06-18 DIAGNOSIS — M25561 Pain in right knee: Secondary | ICD-10-CM | POA: Diagnosis not present

## 2019-07-19 DIAGNOSIS — M25561 Pain in right knee: Secondary | ICD-10-CM | POA: Diagnosis not present

## 2019-08-21 DIAGNOSIS — G894 Chronic pain syndrome: Secondary | ICD-10-CM | POA: Diagnosis not present

## 2019-08-21 DIAGNOSIS — M25561 Pain in right knee: Secondary | ICD-10-CM | POA: Diagnosis not present

## 2019-08-21 DIAGNOSIS — G5771 Causalgia of right lower limb: Secondary | ICD-10-CM | POA: Diagnosis not present

## 2019-09-03 ENCOUNTER — Telehealth: Payer: Self-pay | Admitting: Adult Health

## 2019-09-03 MED ORDER — LORAZEPAM 2 MG PO TABS: 2.0000 mg | ORAL_TABLET | Freq: Three times a day (TID) | ORAL | 1 refills | Status: DC | PRN

## 2019-09-03 NOTE — Telephone Encounter (Signed)
I called pts mother.  She is asking for refill of lorazepam, last fill in 2018.  He is very anxious, and she is afraid he works himself into seizure.  He has been having a lot of pain with knee problems.  He is taking his sz medications ( as ordered.  ? Had seizures last night (in bed).  She is not sure, but she stated his tongue hurt and he was laying on his stomache, which she states he never does.  Drug registry checked no lorazepam, only oxycodone 07/20/19.  Please advise.

## 2019-09-03 NOTE — Telephone Encounter (Signed)
I called the patient and talk with the mother.  The patient has had some increase in anxiety recently partly associated with significant knee pain that he has been treated for.  The patient be given another prescription for the Ativan, the last prescription was given 2 years ago.

## 2019-09-03 NOTE — Telephone Encounter (Signed)
Bolinger,Merinda(mother on DPR) has called to report that she has picked up pt's medication LORazepam (ATIVAN) 2 MG tablet.  Mother states pt believes he had a seizure, mother wants to know if pt can take LORazepam (ATIVAN) 2 MG tablet one a day to keep him calm, please call

## 2019-09-03 NOTE — Telephone Encounter (Signed)
Zwack,Merinda(mother on DPR) has called back to say that she has not picked up the medication and that she has checked with the pharmacy [facility] [phone] and they have yet to receive the request for the refill of LORazepam (ATIVAN) 2 MG tablet.  Pt's mother advised to give a couple of hours .  Mother has asked that a note be placed in the system.

## 2019-09-03 NOTE — Addendum Note (Signed)
Addended by: Guy Begin on: 09/03/2019 04:08 PM   Modules accepted: Orders

## 2019-09-03 NOTE — Telephone Encounter (Signed)
Zeimet,Merinda(mother) has called for a refill LORazepam (ATIVAN) 2 MG tablet  [facility] [phone]

## 2019-09-03 NOTE — Addendum Note (Signed)
Addended by: York Spaniel on: 09/03/2019 04:58 PM   Modules accepted: Orders

## 2019-09-04 NOTE — Telephone Encounter (Signed)
Medication has been refilled by Dr. Anne Hahn.

## 2019-09-04 NOTE — Telephone Encounter (Signed)
[facility]ivan [facility] to [facility] in [place] . Received a receipt of confirmation.

## 2019-09-17 DIAGNOSIS — H0102A Squamous blepharitis right eye, upper and lower eyelids: Secondary | ICD-10-CM | POA: Diagnosis not present

## 2019-09-17 DIAGNOSIS — H47233 Glaucomatous optic atrophy, bilateral: Secondary | ICD-10-CM | POA: Diagnosis not present

## 2019-09-17 DIAGNOSIS — H25013 Cortical age-related cataract, bilateral: Secondary | ICD-10-CM | POA: Diagnosis not present

## 2019-09-17 DIAGNOSIS — H401130 Primary open-angle glaucoma, bilateral, stage unspecified: Secondary | ICD-10-CM | POA: Diagnosis not present

## 2019-09-17 DIAGNOSIS — H11153 Pinguecula, bilateral: Secondary | ICD-10-CM | POA: Diagnosis not present

## 2019-09-17 DIAGNOSIS — H04123 Dry eye syndrome of bilateral lacrimal glands: Secondary | ICD-10-CM | POA: Diagnosis not present

## 2019-09-17 DIAGNOSIS — H0102B Squamous blepharitis left eye, upper and lower eyelids: Secondary | ICD-10-CM | POA: Diagnosis not present

## 2019-09-17 DIAGNOSIS — H47293 Other optic atrophy, bilateral: Secondary | ICD-10-CM | POA: Diagnosis not present

## 2019-09-17 DIAGNOSIS — H18413 Arcus senilis, bilateral: Secondary | ICD-10-CM | POA: Diagnosis not present

## 2019-09-18 DIAGNOSIS — M1711 Unilateral primary osteoarthritis, right knee: Secondary | ICD-10-CM | POA: Diagnosis not present

## 2019-09-18 DIAGNOSIS — G5771 Causalgia of right lower limb: Secondary | ICD-10-CM | POA: Diagnosis not present

## 2019-09-18 DIAGNOSIS — Z79899 Other long term (current) drug therapy: Secondary | ICD-10-CM | POA: Diagnosis not present

## 2019-10-05 DIAGNOSIS — M25561 Pain in right knee: Secondary | ICD-10-CM | POA: Diagnosis not present

## 2019-10-05 DIAGNOSIS — M1710 Unilateral primary osteoarthritis, unspecified knee: Secondary | ICD-10-CM | POA: Diagnosis not present

## 2019-10-05 DIAGNOSIS — M1711 Unilateral primary osteoarthritis, right knee: Secondary | ICD-10-CM | POA: Diagnosis not present

## 2019-10-05 DIAGNOSIS — G894 Chronic pain syndrome: Secondary | ICD-10-CM | POA: Diagnosis not present

## 2019-11-06 DIAGNOSIS — M1710 Unilateral primary osteoarthritis, unspecified knee: Secondary | ICD-10-CM | POA: Diagnosis not present

## 2019-11-06 DIAGNOSIS — M1711 Unilateral primary osteoarthritis, right knee: Secondary | ICD-10-CM | POA: Diagnosis not present

## 2019-11-13 DIAGNOSIS — M1710 Unilateral primary osteoarthritis, unspecified knee: Secondary | ICD-10-CM | POA: Diagnosis not present

## 2019-11-20 DIAGNOSIS — M1711 Unilateral primary osteoarthritis, right knee: Secondary | ICD-10-CM | POA: Diagnosis not present

## 2019-12-11 DIAGNOSIS — E782 Mixed hyperlipidemia: Secondary | ICD-10-CM | POA: Diagnosis not present

## 2019-12-11 DIAGNOSIS — Z0000 Encounter for general adult medical examination without abnormal findings: Secondary | ICD-10-CM | POA: Diagnosis not present

## 2019-12-11 DIAGNOSIS — H409 Unspecified glaucoma: Secondary | ICD-10-CM | POA: Diagnosis not present

## 2019-12-11 DIAGNOSIS — Z720 Tobacco use: Secondary | ICD-10-CM | POA: Diagnosis not present

## 2019-12-11 DIAGNOSIS — L309 Dermatitis, unspecified: Secondary | ICD-10-CM | POA: Diagnosis not present

## 2019-12-11 DIAGNOSIS — M810 Age-related osteoporosis without current pathological fracture: Secondary | ICD-10-CM | POA: Diagnosis not present

## 2019-12-11 DIAGNOSIS — K580 Irritable bowel syndrome with diarrhea: Secondary | ICD-10-CM | POA: Diagnosis not present

## 2019-12-11 DIAGNOSIS — G40909 Epilepsy, unspecified, not intractable, without status epilepticus: Secondary | ICD-10-CM | POA: Diagnosis not present

## 2019-12-11 DIAGNOSIS — G8921 Chronic pain due to trauma: Secondary | ICD-10-CM | POA: Diagnosis not present

## 2019-12-11 DIAGNOSIS — H269 Unspecified cataract: Secondary | ICD-10-CM | POA: Diagnosis not present

## 2020-01-17 DIAGNOSIS — M1711 Unilateral primary osteoarthritis, right knee: Secondary | ICD-10-CM | POA: Diagnosis not present

## 2020-01-17 DIAGNOSIS — Z79899 Other long term (current) drug therapy: Secondary | ICD-10-CM | POA: Diagnosis not present

## 2020-01-17 DIAGNOSIS — G894 Chronic pain syndrome: Secondary | ICD-10-CM | POA: Diagnosis not present

## 2020-02-06 DIAGNOSIS — Z23 Encounter for immunization: Secondary | ICD-10-CM | POA: Diagnosis not present

## 2020-02-25 DIAGNOSIS — H0102A Squamous blepharitis right eye, upper and lower eyelids: Secondary | ICD-10-CM | POA: Diagnosis not present

## 2020-02-25 DIAGNOSIS — H401131 Primary open-angle glaucoma, bilateral, mild stage: Secondary | ICD-10-CM | POA: Diagnosis not present

## 2020-02-25 DIAGNOSIS — H25013 Cortical age-related cataract, bilateral: Secondary | ICD-10-CM | POA: Diagnosis not present

## 2020-02-25 DIAGNOSIS — H47293 Other optic atrophy, bilateral: Secondary | ICD-10-CM | POA: Diagnosis not present

## 2020-02-25 DIAGNOSIS — H04123 Dry eye syndrome of bilateral lacrimal glands: Secondary | ICD-10-CM | POA: Diagnosis not present

## 2020-02-27 DIAGNOSIS — Z23 Encounter for immunization: Secondary | ICD-10-CM | POA: Diagnosis not present

## 2020-03-04 DIAGNOSIS — G894 Chronic pain syndrome: Secondary | ICD-10-CM | POA: Diagnosis not present

## 2020-03-04 DIAGNOSIS — Z79891 Long term (current) use of opiate analgesic: Secondary | ICD-10-CM | POA: Diagnosis not present

## 2020-04-19 ENCOUNTER — Other Ambulatory Visit: Payer: Self-pay | Admitting: Neurology

## 2020-05-05 ENCOUNTER — Telehealth (INDEPENDENT_AMBULATORY_CARE_PROVIDER_SITE_OTHER): Payer: 59 | Admitting: Adult Health

## 2020-05-05 DIAGNOSIS — G40209 Localization-related (focal) (partial) symptomatic epilepsy and epileptic syndromes with complex partial seizures, not intractable, without status epilepticus: Secondary | ICD-10-CM | POA: Diagnosis not present

## 2020-05-05 MED ORDER — TOPIRAMATE 100 MG PO TABS: 100.0000 mg | ORAL_TABLET | Freq: Every day | ORAL | 3 refills | Status: DC

## 2020-05-05 MED ORDER — TOPIRAMATE 200 MG PO TABS: 200.0000 mg | ORAL_TABLET | Freq: Two times a day (BID) | ORAL | 3 refills | Status: DC

## 2020-05-05 MED ORDER — LEVETIRACETAM 1000 MG PO TABS: 1000.0000 mg | ORAL_TABLET | Freq: Two times a day (BID) | ORAL | 0 refills | Status: DC
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
## 2020-05-06 NOTE — Progress Notes (Signed)
I have read the note, and I agree with the clinical assessment and plan.  Suann Klier K Elfida Shimada   

## 2020-08-05 HISTORY — PX: OTHER SURGICAL HISTORY: SHX169

## 2020-10-05 ENCOUNTER — Other Ambulatory Visit: Payer: Self-pay | Admitting: Adult Health

## 2021-02-05 ENCOUNTER — Other Ambulatory Visit: Payer: Self-pay | Admitting: Adult Health

## 2021-04-24 ENCOUNTER — Other Ambulatory Visit: Payer: Self-pay | Admitting: Adult Health

## 2021-04-28 ENCOUNTER — Telehealth (INDEPENDENT_AMBULATORY_CARE_PROVIDER_SITE_OTHER): Payer: 59 | Admitting: Adult Health

## 2021-04-28 DIAGNOSIS — G40209 Localization-related (focal) (partial) symptomatic epilepsy and epileptic syndromes with complex partial seizures, not intractable, without status epilepticus: Secondary | ICD-10-CM | POA: Diagnosis not present
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
## 2021-05-03 ENCOUNTER — Other Ambulatory Visit: Payer: Self-pay | Admitting: Adult Health

## 2021-05-04 ENCOUNTER — Other Ambulatory Visit: Payer: Self-pay | Admitting: Adult Health

## 2021-06-05 ENCOUNTER — Other Ambulatory Visit (HOSPITAL_COMMUNITY): Payer: Self-pay

## 2021-07-24 ENCOUNTER — Other Ambulatory Visit: Payer: Self-pay | Admitting: Adult Health

## 2021-10-22 ENCOUNTER — Other Ambulatory Visit: Payer: Self-pay | Admitting: Adult Health

## 2022-01-16 ENCOUNTER — Other Ambulatory Visit: Payer: Self-pay | Admitting: Adult Health

## 2022-04-20 ENCOUNTER — Other Ambulatory Visit: Payer: Self-pay | Admitting: Adult Health

## 2022-04-20 DIAGNOSIS — G40209 Localization-related (focal) (partial) symptomatic epilepsy and epileptic syndromes with complex partial seizures, not intractable, without status epilepticus: Secondary | ICD-10-CM

## 2022-04-28 ENCOUNTER — Ambulatory Visit (INDEPENDENT_AMBULATORY_CARE_PROVIDER_SITE_OTHER): Payer: 59 | Admitting: Adult Health

## 2022-04-28 ENCOUNTER — Encounter: Payer: Self-pay | Admitting: Adult Health

## 2022-04-28 VITALS — BP 103/68 | HR 78 | Ht 71.0 in | Wt 211.0 lb

## 2022-04-28 DIAGNOSIS — G40209 Localization-related (focal) (partial) symptomatic epilepsy and epileptic syndromes with complex partial seizures, not intractable, without status epilepticus: Secondary | ICD-10-CM | POA: Diagnosis not present
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
## 2022-10-05 ENCOUNTER — Other Ambulatory Visit: Payer: Self-pay | Admitting: Adult Health

## 2022-10-18 ENCOUNTER — Telehealth: Payer: Self-pay | Admitting: Adult Health

## 2022-10-18 NOTE — Telephone Encounter (Signed)
Pt mother called. Stated pt has been seeing Dr. Dione Booze, Stated pt has been complaining about losing his eye site for a few seconds, and he complaining about headaches. She is asking if Aundra Millet will order a CT scan for pt. She is requesting a call back from nurse to discuss.

## 2022-10-22 NOTE — Telephone Encounter (Signed)
It looks as though the message from phone rep on 04-15 was not routed to a POD.  Pt's mother is calling now for  a response.Mother states pt has not experienced the loss of vision again but she would still very much like a call from [employer]

## 2022-10-25 NOTE — Telephone Encounter (Signed)
I called mother of pt.  He has only seen dr. Anne Hahn and now [facility] NP for his sz.  Mother of pt calling asking if megan NP would see for headaches.  He had a fall, about maybe 2.5 weeks ago. Mother was not aware at the time.  He mentioned later.    Since then headaches.  Did not have any medical attention.  (No pcp or ED).  Pt has had TBI and trust issues with other doctors.  I relayed that would be glad to ask but did relay that since new problem would need to see MD.  I relayed that if increasing worsening headaches, to seek closer ED visit (as they can do imaging)  she wanted [place] to to CT head.  He has had no seizures.  He has appt with Dr. Dione Booze 11-10-2022.

## 2022-10-26 NOTE — Telephone Encounter (Signed)
Please let patient know that he should first follow-up with his PCP.  I do not mind seeing the patient however I do not have a sooner appointment.  He can get in with his PCP first and then follow-up with [place] if needed

## 2022-10-26 NOTE — Telephone Encounter (Signed)
I called mother of pt back.   I relayed that recommendation from [facility], NP was to see pcp initially, and as she did not have sooner appt that see pcp first then follow up with [place] as needed.  She then mentioned that pt had lost his vision for about a minute at the time (she did mention this to me but I did not place in original message) and that was the reason for the visit along with headaches.    Has appt with Dr. Dione Booze 11-10-2022, but I also mentioned previously as well to go to ED as well. I apologized for that.  She will seek neurology care closer to where they live, and that will speak to pcp.

## 2023-02-07 ENCOUNTER — Encounter: Payer: Self-pay | Admitting: Neurology

## 2023-02-07 ENCOUNTER — Ambulatory Visit (INDEPENDENT_AMBULATORY_CARE_PROVIDER_SITE_OTHER): Payer: 59 | Admitting: Neurology

## 2023-02-07 VITALS — BP 101/69 | HR 98 | Ht 71.0 in | Wt 215.0 lb

## 2023-02-07 DIAGNOSIS — S069X9S Unspecified intracranial injury with loss of consciousness of unspecified duration, sequela: Secondary | ICD-10-CM

## 2023-02-07 DIAGNOSIS — G44229 Chronic tension-type headache, not intractable: Secondary | ICD-10-CM

## 2023-02-07 DIAGNOSIS — G40209 Localization-related (focal) (partial) symptomatic epilepsy and epileptic syndromes with complex partial seizures, not intractable, without status epilepticus: Secondary | ICD-10-CM | POA: Diagnosis not present

## 2023-02-07 DIAGNOSIS — Z5181 Encounter for therapeutic drug level monitoring: Secondary | ICD-10-CM

## 2023-02-07 MED ORDER — VALTOCO 15 MG DOSE 7.5 MG/0.1ML NA LQPK: 15.0000 mg | NASAL | 0 refills | Status: DC | PRN
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
## 2023-02-07 NOTE — Patient Instructions (Addendum)
Continue  with Keppra 1000 mg twice daily  Decrease Topiramate to 200 mg twice daily for 3 months Starting November 1, please decrease Topamax to 100 mg in the morning and 200 mg in the evening  Will check antiseizure medication today  Valtoco as needed for rescue medication in the case of breakthrough seizure  Aleve as needed for he headaches  Repeat head CT for new complaints of headaches  Return in 6 months or sooner if worse

## 2023-02-10 ENCOUNTER — Telehealth: Payer: Self-pay

## 2023-02-10 ENCOUNTER — Other Ambulatory Visit (HOSPITAL_COMMUNITY): Payer: Self-pay

## 2023-02-10 NOTE — Telephone Encounter (Signed)
Pharmacy Patient Advocate Encounter   Received notification from CoverMyMeds that prior authorization for Valtoco 15 MG Dose 7.5MG /0.1ML liquid  is required/requested.   Insurance verification completed.   The patient is insured through [facility] .   Per test claim: PA required; PA submitted to [facility] via CoverMyMeds Key/confirmation #/EOC [id number] Status is pending

## 2023-02-14 ENCOUNTER — Other Ambulatory Visit: Payer: Self-pay | Admitting: Neurology

## 2023-02-14 MED ORDER — NAYZILAM 5 MG/0.1ML NA SOLN: 5.0000 mg | NASAL | 0 refills | Status: AC | PRN

## 2023-02-14 NOTE — Telephone Encounter (Signed)
Will try Nayzilam since Valtoco not covered

## 2023-02-14 NOTE — Telephone Encounter (Signed)
Pharmacy Patient Advocate Encounter  Received notification from [facility] that Prior Authorization for Valtoco 15 MG Dose 7.5MG /0.1ML liquid has been DENIED. Please advise how you'd like to proceed. Full denial letter will be uploaded to the media tab. See denial reason below.    PA #/Case ID/Reference #: PA Case ID #: [id number]

## 2023-02-17 ENCOUNTER — Telehealth: Payer: Self-pay

## 2023-02-17 ENCOUNTER — Other Ambulatory Visit (HOSPITAL_COMMUNITY): Payer: Self-pay

## 2023-02-17 NOTE — Telephone Encounter (Signed)
PA request has been Submitted. New Encounter created for follow up. For additional info see Pharmacy Prior Auth telephone encounter from 02-17-2023. This was submitted as expedited.

## 2023-02-17 NOTE — Telephone Encounter (Signed)
Pt's mother, Marvion Michalak checking on PA for Midazolam (NAYZILAM) 5 MG/0.1ML SOLN. Would like a call from the nurse.

## 2023-02-17 NOTE — Telephone Encounter (Signed)
Pharmacy Patient Advocate Encounter   Received notification from Pt Calls Messages that prior authorization for Nayzilam 5MG /0.1ML solution is required/requested.   Insurance verification completed.   The patient is insured through  [employer] Medicare Part D  .   Per test claim: PA required; PA submitted to [employer] Medicare Part D via CoverMyMeds Key/confirmation #/EOC [id number] Status is pending   *This has been submitted as expedited

## 2023-02-17 NOTE — Telephone Encounter (Signed)
Phone room: Please tell pt that were have a pa team working on it and they sent to insurance as urgent

## 2023-02-18 NOTE — Telephone Encounter (Signed)
Pharmacy Patient Advocate Encounter  Received notification from  [employer] Part D  that Prior Authorization for Nayzilam 5MG /0. solution has been DENIED. Please advise how you'd like to proceed. Full denial letter will be uploaded to the media tab. See denial reason below.  Insurance WILL pay for a quantity limit of 10 devices per 30 days  PA #/Case ID/Reference #: [id number]

## 2023-02-21 ENCOUNTER — Telehealth: Payer: Self-pay | Admitting: Neurology

## 2023-02-21 NOTE — Telephone Encounter (Signed)
no auth required sent to [facility] [phone]. Faxed to [phone]

## 2023-02-21 NOTE — Telephone Encounter (Signed)
Called and informed pt's mother. She verbalized appreciation.

## 2023-02-28 NOTE — Telephone Encounter (Signed)
Please review

## 2023-03-01 ENCOUNTER — Other Ambulatory Visit (HOSPITAL_COMMUNITY): Payer: Self-pay

## 2023-03-01 NOTE — Telephone Encounter (Signed)
Mychart message sent.

## 2023-03-01 NOTE — Telephone Encounter (Signed)
Thanks

## 2023-03-21 ENCOUNTER — Telehealth: Payer: Self-pay | Admitting: Neurology

## 2023-03-21 NOTE — Telephone Encounter (Signed)
Pt is asking for a call with results to CT scan

## 2023-03-21 NOTE — Telephone Encounter (Signed)
Phone room tell the pt that we haven't seen result. Once we receive we will call.  Thanks,  Production assistant, radio

## 2023-03-22 NOTE — Telephone Encounter (Signed)
Pt's mother called stating that she has mailed the disk to the office and would like to know if it has been reviewed.

## 2023-04-01 ENCOUNTER — Encounter: Payer: Self-pay | Admitting: Neurology

## 2023-04-01 NOTE — Progress Notes (Signed)
CT head February 24, 2023 Mild bifrontal encephalomalacia.  No acute intracranial processes.

## 2023-04-04 ENCOUNTER — Telehealth: Payer: Self-pay

## 2023-04-04 NOTE — Telephone Encounter (Signed)
-----   Message from [facility] sent at 04/01/2023 12:20 PM EDT ----- Please contact patient, mother and informed them the head CT did not show any new abnormality.  The encephalomalacia was seen in his previous head CT, and consistent with history of traumatic brain injury.

## 2023-04-29 ENCOUNTER — Other Ambulatory Visit: Payer: Self-pay | Admitting: Adult Health

## 2023-05-02 NOTE — Telephone Encounter (Signed)
Rx refilled.

## 2023-05-03 ENCOUNTER — Other Ambulatory Visit: Payer: Self-pay | Admitting: Adult Health

## 2023-05-03 DIAGNOSIS — G40209 Localization-related (focal) (partial) symptomatic epilepsy and epileptic syndromes with complex partial seizures, not intractable, without status epilepticus: Secondary | ICD-10-CM

## 2023-05-04 ENCOUNTER — Ambulatory Visit: Payer: 59 | Admitting: Adult Health

## 2023-08-01 ENCOUNTER — Encounter: Payer: Self-pay | Admitting: Neurology

## 2023-08-01 ENCOUNTER — Ambulatory Visit (INDEPENDENT_AMBULATORY_CARE_PROVIDER_SITE_OTHER): Payer: 59 | Admitting: Neurology

## 2023-08-01 VITALS — BP 113/77 | HR 82 | Ht 71.0 in | Wt 217.5 lb

## 2023-08-01 DIAGNOSIS — G40209 Localization-related (focal) (partial) symptomatic epilepsy and epileptic syndromes with complex partial seizures, not intractable, without status epilepticus: Secondary | ICD-10-CM

## 2023-08-01 MED ORDER — TOPIRAMATE 100 MG PO TABS: 100.0000 mg | ORAL_TABLET | Freq: Two times a day (BID) | ORAL | 3 refills | Status: DC

## 2023-08-01 NOTE — Patient Instructions (Signed)
Decrease Topiramate to 100 mg twice daily  Continue your other medications  Call for breakthrough seizure  Return in 6 months or sooner if worse
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
## 2024-02-28 ENCOUNTER — Telehealth: Payer: Self-pay | Admitting: Neurology

## 2024-02-28 NOTE — Telephone Encounter (Signed)
 Pt called to confirm appt '  Appt Confirm

## 2024-03-01 ENCOUNTER — Encounter: Payer: Self-pay | Admitting: Neurology

## 2024-03-01 ENCOUNTER — Ambulatory Visit: Payer: 59 | Admitting: Neurology

## 2024-03-01 VITALS — BP 103/75 | HR 80 | Ht 70.0 in | Wt 216.0 lb

## 2024-03-01 DIAGNOSIS — S069X9S Unspecified intracranial injury with loss of consciousness of unspecified duration, sequela: Secondary | ICD-10-CM

## 2024-03-01 DIAGNOSIS — G40209 Localization-related (focal) (partial) symptomatic epilepsy and epileptic syndromes with complex partial seizures, not intractable, without status epilepticus: Secondary | ICD-10-CM | POA: Diagnosis not present

## 2024-03-01 MED ORDER — LEVETIRACETAM 1000 MG PO TABS: 1000.0000 mg | ORAL_TABLET | Freq: Two times a day (BID) | ORAL | 3 refills | Status: AC

## 2024-03-01 NOTE — Patient Instructions (Addendum)
 Decrease Topiramate  to 50 mg nightly for 1 week then stop the medication Continue with Keppra  1000 mg twice daily Continue with Nayzilam  as needed for rescue medication Return in 6 to 8 months or sooner if worse
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
## 2024-11-12 ENCOUNTER — Ambulatory Visit: Admitting: Neurology
